# Patient Record
Sex: Male | Born: 1995 | Race: Black or African American | Hispanic: No | Marital: Single | Smoking: Never smoker
Health system: Southern US, Community
[De-identification: ages and names within clinical notes are randomized; demographics above are authoritative.]

## PROBLEM LIST (undated history)

## (undated) HISTORY — PX: APPENDECTOMY: SHX54

---
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
## 2017-04-06 DIAGNOSIS — R5381 Other malaise: Secondary | ICD-10-CM | POA: Diagnosis not present

## 2017-04-06 DIAGNOSIS — Z0000 Encounter for general adult medical examination without abnormal findings: Secondary | ICD-10-CM | POA: Diagnosis not present

## 2017-04-06 DIAGNOSIS — Z13220 Encounter for screening for lipoid disorders: Secondary | ICD-10-CM | POA: Diagnosis not present

## 2017-05-05 ENCOUNTER — Emergency Department (HOSPITAL_BASED_OUTPATIENT_CLINIC_OR_DEPARTMENT_OTHER): Payer: Self-pay

## 2017-05-05 ENCOUNTER — Emergency Department (HOSPITAL_BASED_OUTPATIENT_CLINIC_OR_DEPARTMENT_OTHER)
Admission: EM | Admit: 2017-05-05 | Discharge: 2017-05-05 | Disposition: A | Discharge status: Home / Self Care | Payer: Self-pay | Attending: Emergency Medicine | Admitting: Emergency Medicine

## 2017-05-05 ENCOUNTER — Encounter (HOSPITAL_BASED_OUTPATIENT_CLINIC_OR_DEPARTMENT_OTHER): Payer: Self-pay

## 2017-05-05 DIAGNOSIS — Y9361 Activity, american tackle football: Secondary | ICD-10-CM | POA: Insufficient documentation

## 2017-05-05 DIAGNOSIS — W0110XA Fall on same level from slipping, tripping and stumbling with subsequent striking against unspecified object, initial encounter: Secondary | ICD-10-CM | POA: Insufficient documentation

## 2017-05-05 DIAGNOSIS — T300 Burn of unspecified body region, unspecified degree: Secondary | ICD-10-CM

## 2017-05-05 DIAGNOSIS — T24132A Burn of first degree of left lower leg, initial encounter: Secondary | ICD-10-CM | POA: Insufficient documentation

## 2017-05-05 DIAGNOSIS — M79645 Pain in left finger(s): Secondary | ICD-10-CM | POA: Insufficient documentation

## 2017-05-05 DIAGNOSIS — Y92321 Football field as the place of occurrence of the external cause: Secondary | ICD-10-CM | POA: Insufficient documentation

## 2017-05-05 DIAGNOSIS — Y999 Unspecified external cause status: Secondary | ICD-10-CM | POA: Insufficient documentation

## 2017-05-05 DIAGNOSIS — Y92410 Unspecified street and highway as the place of occurrence of the external cause: Secondary | ICD-10-CM | POA: Insufficient documentation

## 2017-05-05 MED ORDER — SILVER SULFADIAZINE 1 % EX CREA: 1.0000 "application " | TOPICAL_CREAM | Freq: Every day | CUTANEOUS | 0 refills | Status: DC

## 2017-05-05 MED ORDER — IBUPROFEN 400 MG PO TABS
600.0000 mg | ORAL_TABLET | Freq: Once | ORAL | Status: AC
  Administered 2017-05-05: 17:00:00 600 mg via ORAL
  Filled 2017-05-05: qty 1

## 2017-05-05 NOTE — ED Triage Notes (Addendum)
Pt c/o "turf burn" to left LE x 3 days ago playing football-injured left thumb yesterday plalying football-NAD-steady gait

## 2017-05-05 NOTE — ED Notes (Signed)
Mild swelling noted lateral L thumb, ROM intact. L lower leg has significant road rash over anterior surface. Wound bed is pink with no exudate noted.

## 2017-05-05 NOTE — ED Provider Notes (Signed)
[facility] Provider [facility]   CSN: [id number] Arrival date & time: 05/05/17  1359     History   Chief Complaint Chief Complaint  Patient presents with  . Leg Injury  . Finger Injury    HPI Alex Scott is a 21 y.o. male who presents to the emergency department today for lower leg injury and left thumb injury. The patient states that 3 days ago while playing football he presented to make a tackle and slid on the turf. He experienced a superficial burn to the lower leg as a result of this. He notes that this is very painful to touch but is otherwise asymptomatic. He notes no drainage over the area. This was evaluated by his athletic trainer who applied antibiotic ointment and dressing. While at practice yesterday the patient was attempting to make a tackle when he fell on an outstretched hand. Since he has had the pain at the base of the left thumb. This is aggravated with range of motion and palpation. He has not taken anything for this. He denies fever, chills, numbness, tingling of extremities.  HPI  History reviewed. No pertinent past medical history.  There are no active problems to display for this patient.   Past Surgical History:  Procedure Laterality Date  . APPENDECTOMY         Home Medications    Prior to Admission medications   Medication Sig Start Date End Date Taking? Authorizing Provider  silver sulfADIAZINE (SILVADENE) 1 % cream Apply 1 application topically daily. 05/05/17   Lacy Sofia, Elmer Sow, PA-C    Family History No family history on file.  Social History Social History  Substance Use Topics  . Smoking status: Never Smoker  . Smokeless tobacco: Never Used  . Alcohol use No     Allergies   Patient has no known allergies.   Review of Systems Review of Systems  Constitutional: Negative for chills and fever.  Musculoskeletal: Positive for arthralgias. Negative for joint swelling.  Skin: Positive for wound.  Neurological: Negative  for weakness and numbness.     Physical Exam Updated Vital Signs BP 137/69 (BP Location: Left Arm)   Pulse 73   Temp 98.6 F (37 C) (Oral)   Resp 18   Ht 5\' 11"  (1.803 m)   Wt 91.4 kg (201 lb 8 oz)   SpO2 100%   BMI 28.10 kg/m   Physical Exam  Constitutional: He appears well-developed and well-nourished.  HENT:  Head: Normocephalic and atraumatic.  Right Ear: External ear normal.  Left Ear: External ear normal.  Eyes: Conjunctivae are normal. Right eye exhibits no discharge. Left eye exhibits no discharge. No scleral icterus.  Cardiovascular:  Pulses:      Radial pulses are 2+ on the right side, and 2+ on the left side.       Dorsalis pedis pulses are 2+ on the right side, and 2+ on the left side.       Posterior tibial pulses are 2+ on the right side, and 2+ on the left side.  Pulmonary/Chest: Effort normal. No respiratory distress.  Musculoskeletal:       Left knee: Normal.       Left ankle: Normal.  Left lower leg: Compartments soft. Neurovascular intact  No gross deformities, skin intact. Fingers appear normal. Tenderness palpation over snuffbox. Radial arteries 2+ with <2sec cap refill. SILT in M/U/R distributions. Dynamic 2-pt discrimination intact over median and ulnar nerve distributions on the ulnar/radial aspects of digits. Sharp/dull sensation  intact at dorsal 1st webspace. Grip 5/5 strength.  Finger adduction/abduction intact with 5/5 strength.  Thumb opposition mildly limited. Full ROM to Flexion/Extension at wrist, MCP, PIP and DIP.  FDS/FDP intact.   Neurological: He is alert.  Skin: No pallor.  15 cm area of superficial burn to the skin. Tender to palpation. No surrounding heat, erythema, drainage or induration.  Psychiatric: He has a normal mood and affect.  Nursing note and vitals reviewed.      ED Treatments / Results  Labs (all labs ordered are listed, but only abnormal results are displayed) Labs Reviewed - No data to display  EKG  EKG  Interpretation None       Radiology Dg Hand Complete Left  Result Date: 05/05/2017 CLINICAL DATA:  Football injury today. Limited range of motion of the thumb. EXAM: LEFT HAND - COMPLETE 3+ VIEW COMPARISON:  Thumb radiographs, same date. FINDINGS: The joint spaces are maintained.  No acute fracture is identified. IMPRESSION: No acute bony findings. Electronically Signed   By: Rudie Meyer M.D.   On: 05/05/2017 16:32   Dg Finger Thumb Left  Result Date: 05/05/2017 CLINICAL DATA:  Injured thumb playing football yesterday EXAM: LEFT THUMB 2+V COMPARISON:  None. FINDINGS: Alignment is normal. Joint spaces appear normal. No acute fracture is seen. IMPRESSION: Negative. Electronically Signed   By: Dwyane Dee M.D.   On: 05/05/2017 14:38    Procedures Procedures (including critical care time)  Medications Ordered in ED Medications  ibuprofen (ADVIL,MOTRIN) tablet 600 mg (600 mg Oral Given 05/05/17 1705)     Initial Impression / Assessment and Plan / ED Course  I have reviewed the triage vital signs and the nursing notes.  Pertinent labs & imaging results that were available during my care of the patient were reviewed by me and considered in my medical decision making (see chart for details).     21 year old male with left thumb injury and superficial burn to left leg. Patient with snuffbox tenderness, otherwise reassuring hand exam. X-rays are negative for fracture or dislocation. Will place the patient in a thumb spica splint with follow-up to orthopedics. Rice protocol advised. Advised the patient to not play football or contact sports until he is seen by orthopedics. Pain controlled in the emergency department. Patient with superficial burn to the left lower extremity as a result of sliding on turf. Wound cleaned and dressed in the emergency department with application of topical antibiotic ointment. Will prescribe the patient silver sulfadiazine to prevent infection at home. Will have  the patient follow with his PCP this week for wound recheck. Specific return precautions discussed. The patient verbalized understanding and agreement with plan. All questions answered. No further questions at this time. The patient is hemodynamically stable, mentating appropriately and appears safe for discharge.  Final Clinical Impressions(s) / ED Diagnoses   Final diagnoses:  Pain of left thumb  Superficial burn    New Prescriptions Discharge Medication List as of 05/05/2017  5:13 PM    START taking these medications   Details  silver sulfADIAZINE (SILVADENE) 1 % cream Apply 1 application topically daily., Starting Tue 05/05/2017, Print         Avantika Shere, Elmer Sow, PA-C 05/06/17 0139    Nira Conn, MD 05/06/17 [phone]

## 2017-05-05 NOTE — Discharge Instructions (Signed)
Your hand exam is concerning for what is called a Scaphoid fracture. I have attached a handout on this. Please remain in your splint that was provided to you in the ED till then. Your initial x-rays are negative. This is not always found on initial xrays so you are being treated as though you have this. Please follow up with Ortho this week. Please refrain from playing football or contact sports until then. Please follow rice therapy (attached). I have provided you with an antibiotic ointment that should be applied to your skin burn over. The dressing applied here may remain for 1-2 weeks and the ointment can be applied over this. Please return for fever, red streaking up the leg, pus like drainage. Please follow up with your PCP in 1 week for re-check for this.

## 2017-05-12 DIAGNOSIS — S63642A Sprain of metacarpophalangeal joint of left thumb, initial encounter: Secondary | ICD-10-CM | POA: Diagnosis not present

## 2017-05-26 DIAGNOSIS — S63642D Sprain of metacarpophalangeal joint of left thumb, subsequent encounter: Secondary | ICD-10-CM | POA: Diagnosis not present

## 2017-06-04 ENCOUNTER — Ambulatory Visit (INDEPENDENT_AMBULATORY_CARE_PROVIDER_SITE_OTHER): Payer: BLUE CROSS/BLUE SHIELD | Admitting: Physician Assistant

## 2017-06-04 DIAGNOSIS — Z23 Encounter for immunization: Secondary | ICD-10-CM | POA: Diagnosis not present

## 2017-06-15 NOTE — Progress Notes (Signed)
Needs MMR to stay in school.

## 2017-06-16 DIAGNOSIS — S63642D Sprain of metacarpophalangeal joint of left thumb, subsequent encounter: Secondary | ICD-10-CM | POA: Diagnosis not present

## 2017-09-20 DIAGNOSIS — J069 Acute upper respiratory infection, unspecified: Secondary | ICD-10-CM | POA: Diagnosis not present

## 2017-09-20 DIAGNOSIS — R05 Cough: Secondary | ICD-10-CM | POA: Diagnosis not present

## 2017-09-20 DIAGNOSIS — R509 Fever, unspecified: Secondary | ICD-10-CM | POA: Diagnosis not present

## 2017-09-29 IMAGING — CR DG FINGER THUMB 2+V*L*
3 series · 3 of 3 positions shown · non-contrast
Comparison: None.

CLINICAL DATA: Injured thumb playing football yesterday

EXAM:
LEFT THUMB 2+V

[x finger pa left]
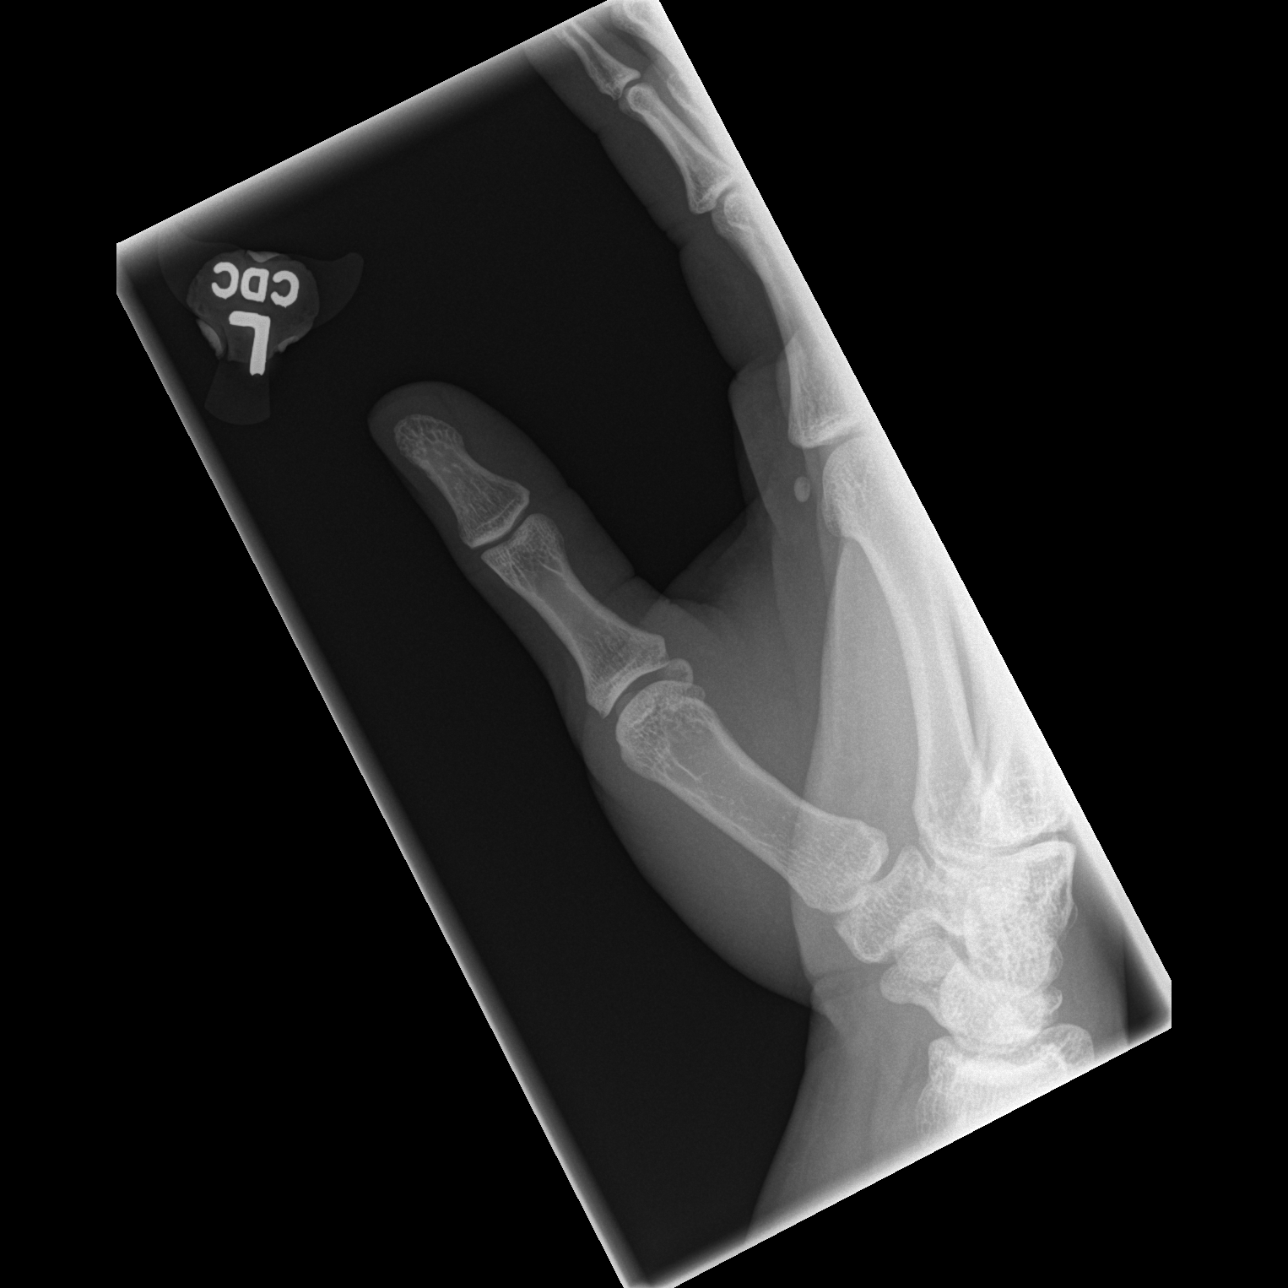

[x finger obl. left]
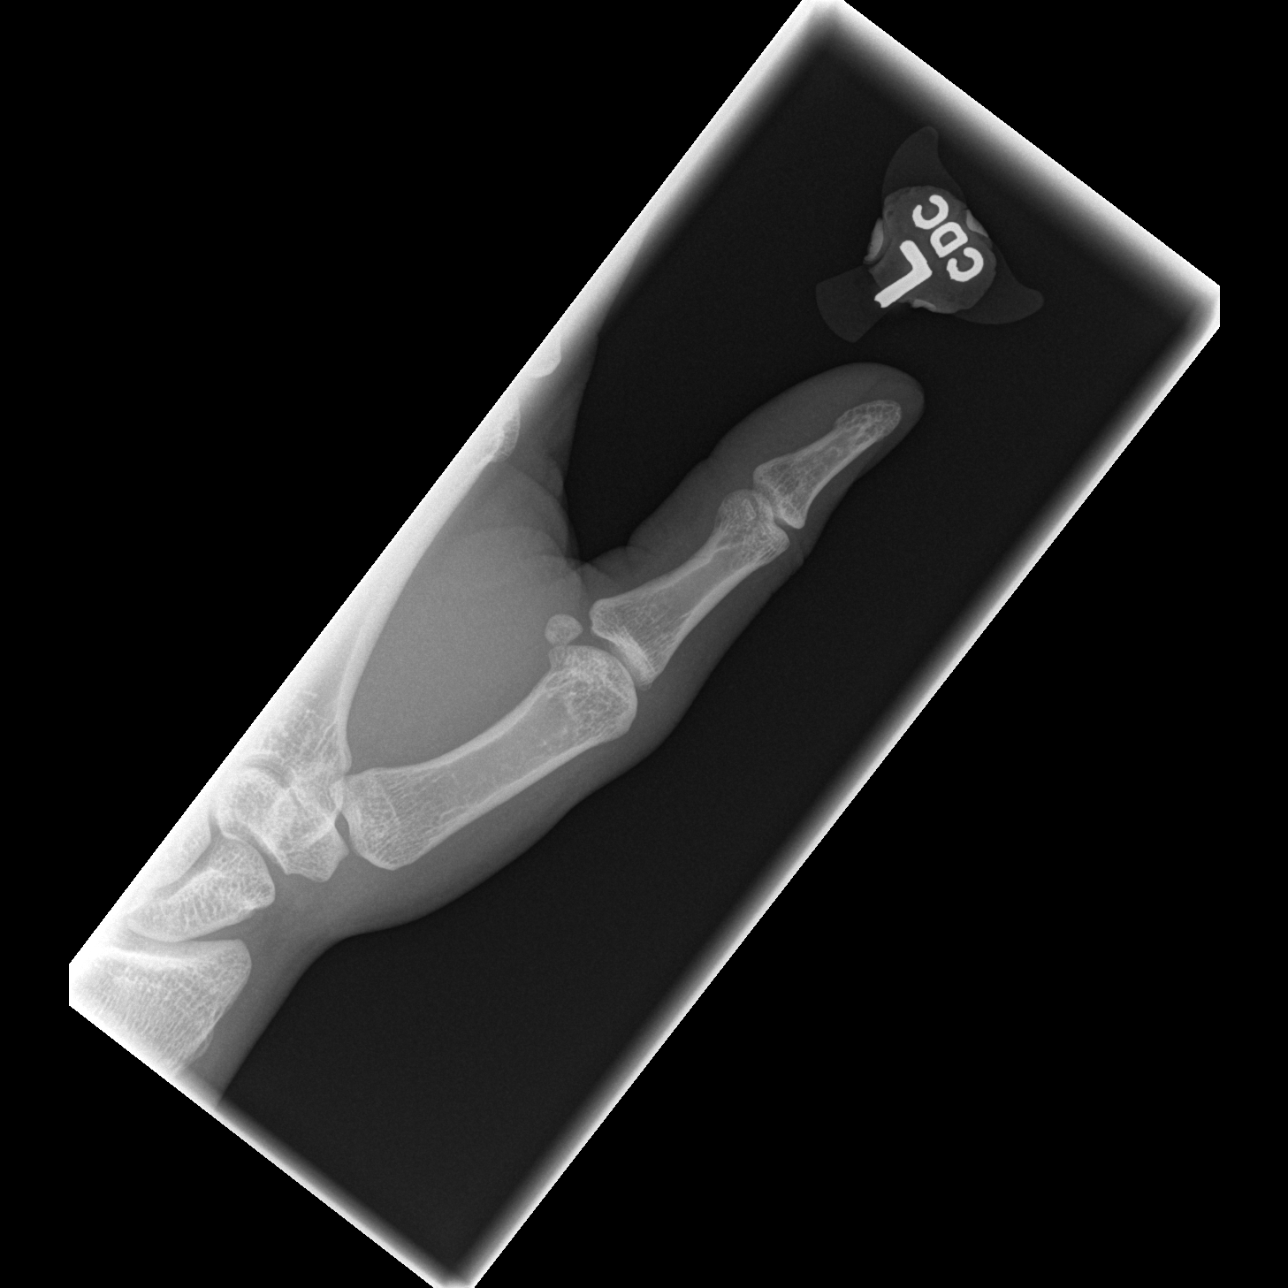

[x finger lateral left]
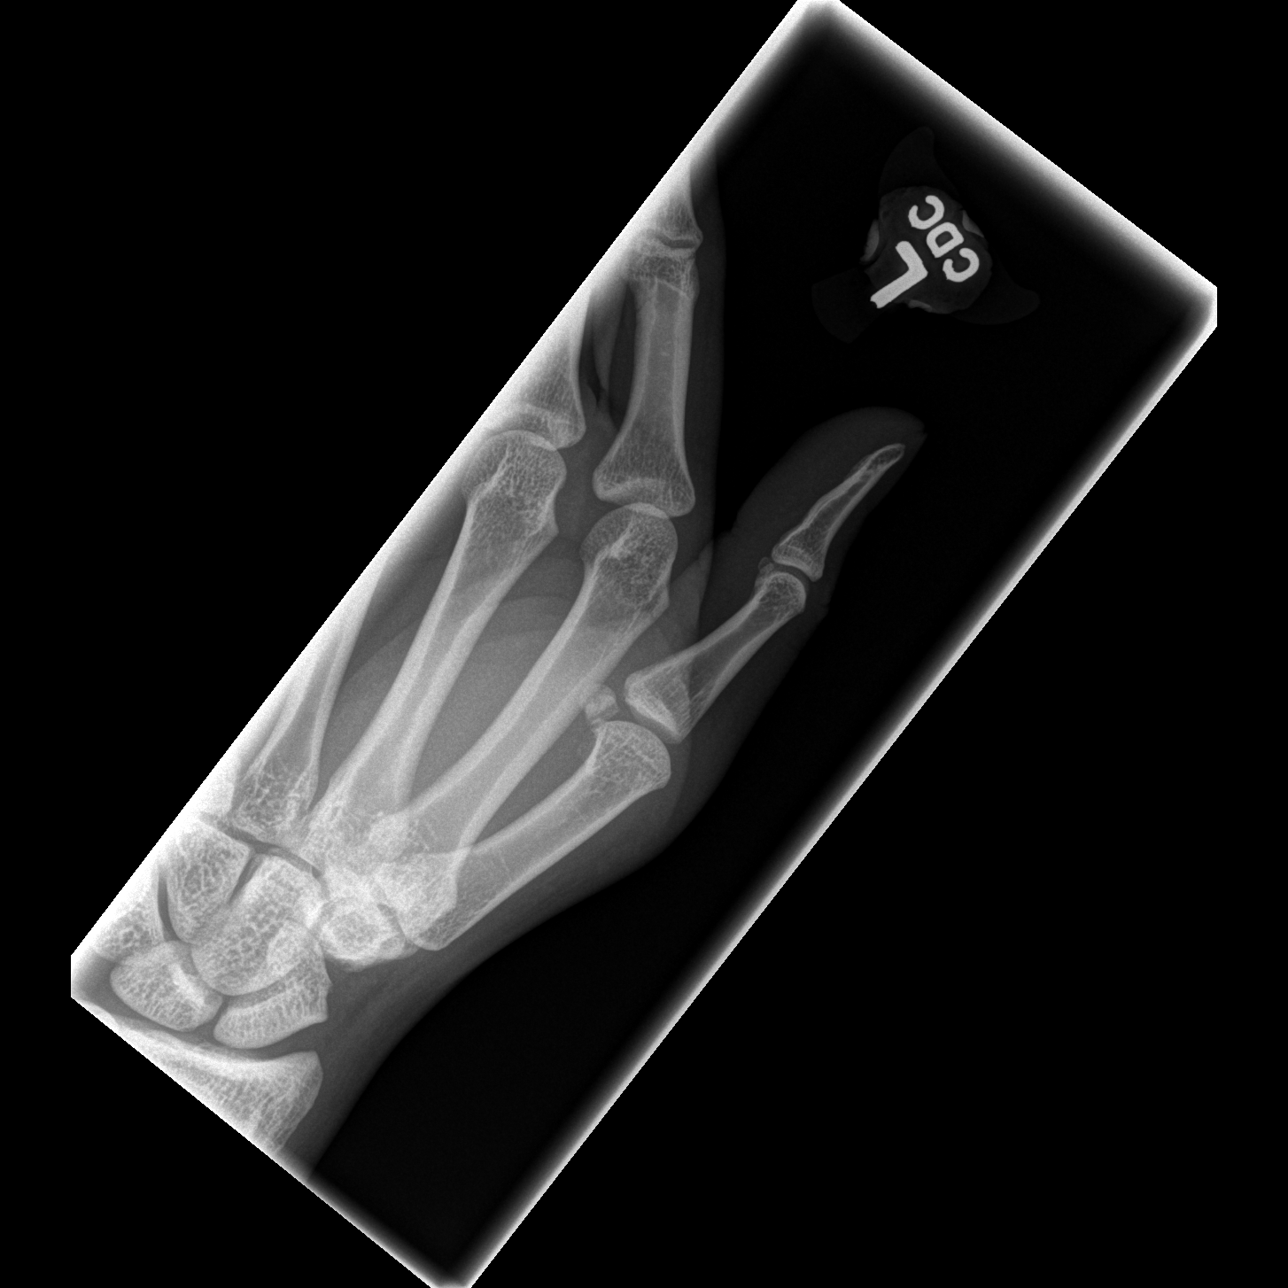

[3 of 3 positions shown; findings below may reference images not displayed]

FINDINGS: Alignment is normal. Joint spaces appear normal. No acute fracture
is seen.
IMPRESSION: Negative.

## 2018-12-20 DIAGNOSIS — R0602 Shortness of breath: Secondary | ICD-10-CM | POA: Diagnosis not present

## 2018-12-20 DIAGNOSIS — R51 Headache: Secondary | ICD-10-CM | POA: Diagnosis not present

## 2018-12-20 DIAGNOSIS — B349 Viral infection, unspecified: Secondary | ICD-10-CM | POA: Diagnosis not present

## 2019-05-27 DIAGNOSIS — N62 Hypertrophy of breast: Secondary | ICD-10-CM | POA: Diagnosis not present

## 2019-05-27 DIAGNOSIS — R5381 Other malaise: Secondary | ICD-10-CM | POA: Diagnosis not present

## 2019-05-27 DIAGNOSIS — R41840 Attention and concentration deficit: Secondary | ICD-10-CM | POA: Diagnosis not present

## 2019-06-27 DIAGNOSIS — M25512 Pain in left shoulder: Secondary | ICD-10-CM | POA: Diagnosis not present

## 2019-06-30 DIAGNOSIS — F12180 Cannabis abuse with cannabis-induced anxiety disorder: Secondary | ICD-10-CM | POA: Diagnosis not present

## 2019-06-30 DIAGNOSIS — F3340 Major depressive disorder, recurrent, in remission, unspecified: Secondary | ICD-10-CM | POA: Diagnosis not present

## 2019-07-12 DIAGNOSIS — F12180 Cannabis abuse with cannabis-induced anxiety disorder: Secondary | ICD-10-CM | POA: Diagnosis not present

## 2019-07-12 DIAGNOSIS — F411 Generalized anxiety disorder: Secondary | ICD-10-CM | POA: Diagnosis not present

## 2019-07-12 DIAGNOSIS — F3340 Major depressive disorder, recurrent, in remission, unspecified: Secondary | ICD-10-CM | POA: Diagnosis not present

## 2019-07-14 DIAGNOSIS — F12180 Cannabis abuse with cannabis-induced anxiety disorder: Secondary | ICD-10-CM | POA: Diagnosis not present

## 2019-07-14 DIAGNOSIS — F411 Generalized anxiety disorder: Secondary | ICD-10-CM | POA: Diagnosis not present

## 2019-07-14 DIAGNOSIS — F3340 Major depressive disorder, recurrent, in remission, unspecified: Secondary | ICD-10-CM | POA: Diagnosis not present

## 2019-08-08 DIAGNOSIS — R079 Chest pain, unspecified: Secondary | ICD-10-CM | POA: Diagnosis not present

## 2019-08-08 DIAGNOSIS — R0789 Other chest pain: Secondary | ICD-10-CM | POA: Diagnosis not present

## 2021-06-25 DIAGNOSIS — R251 Tremor, unspecified: Secondary | ICD-10-CM | POA: Insufficient documentation

## 2023-02-24 ENCOUNTER — Encounter (HOSPITAL_COMMUNITY): Payer: Self-pay

## 2023-02-24 ENCOUNTER — Emergency Department (HOSPITAL_COMMUNITY): Payer: Self-pay

## 2023-02-24 ENCOUNTER — Other Ambulatory Visit: Payer: Self-pay

## 2023-02-24 ENCOUNTER — Observation Stay (HOSPITAL_COMMUNITY)
Admission: EM | Admit: 2023-02-24 | Discharge: 2023-02-26 | Disposition: A | Discharge status: Home / Self Care | Payer: Self-pay | Attending: Internal Medicine | Admitting: Internal Medicine

## 2023-02-24 ENCOUNTER — Observation Stay (HOSPITAL_COMMUNITY): Payer: Self-pay

## 2023-02-24 DIAGNOSIS — J9311 Primary spontaneous pneumothorax: Principal | ICD-10-CM | POA: Insufficient documentation

## 2023-02-24 DIAGNOSIS — J939 Pneumothorax, unspecified: Secondary | ICD-10-CM | POA: Diagnosis present

## 2023-02-24 LAB — CBC WITH DIFFERENTIAL/PLATELET
Abs Immature Granulocytes: 0.01 K/uL (ref 0.00–0.07)
Basophils Absolute: 0 K/uL (ref 0.0–0.1)
Basophils Relative: 0 %
Eosinophils Absolute: 0 K/uL (ref 0.0–0.5)
Eosinophils Relative: 0 %
HCT: 41.7 % (ref 39.0–52.0)
Hemoglobin: 14.8 g/dL (ref 13.0–17.0)
Immature Granulocytes: 0 %
Lymphocytes Relative: 42 %
Lymphs Abs: 2.9 K/uL (ref 0.7–4.0)
MCH: 29.2 pg (ref 26.0–34.0)
MCHC: 35.5 g/dL (ref 30.0–36.0)
MCV: 82.2 fL (ref 80.0–100.0)
Monocytes Absolute: 0.6 K/uL (ref 0.1–1.0)
Monocytes Relative: 9 %
Neutro Abs: 3.3 K/uL (ref 1.7–7.7)
Neutrophils Relative %: 49 %
Platelets: 227 K/uL (ref 150–400)
RBC: 5.07 MIL/uL (ref 4.22–5.81)
RDW: 13.1 % (ref 11.5–15.5)
WBC: 6.8 K/uL (ref 4.0–10.5)
nRBC: 0 % (ref 0.0–0.2)

## 2023-02-24 LAB — BASIC METABOLIC PANEL
Anion gap: 8 (ref 5–15)
BUN: 10 mg/dL (ref 6–20)
CO2: 23 mmol/L (ref 22–32)
Calcium: 9 mg/dL (ref 8.9–10.3)
Chloride: 105 mmol/L (ref 98–111)
Creatinine, Ser: 1.04 mg/dL (ref 0.61–1.24)
GFR, Estimated: >60 mL/min (ref >60)
Glucose, Bld: 97 mg/dL (ref 70–99)
Potassium: 3.8 mmol/L (ref 3.5–5.1)
Sodium: 136 mmol/L (ref 135–145)

## 2023-02-24 LAB — HIV ANTIBODY (ROUTINE TESTING W REFLEX): HIV Screen 4th Generation wRfx: NONREACTIVE

## 2023-02-24 MED ORDER — ENOXAPARIN SODIUM 40 MG/0.4ML IJ SOSY
40.0000 mg | PREFILLED_SYRINGE | INTRAMUSCULAR | Status: DC
  Administered 2023-02-24: 40 mg via SUBCUTANEOUS
  Filled 2023-02-24: qty 0.4

## 2023-02-24 MED ORDER — SODIUM CHLORIDE 0.9% FLUSH
3.0000 mL | Freq: Two times a day (BID) | INTRAVENOUS | Status: DC
  Administered 2023-02-24 – 2023-02-26 (×4): 3 mL via INTRAVENOUS

## 2023-02-24 MED ORDER — ACETAMINOPHEN 500 MG PO TABS: 500.0000 mg | ORAL_TABLET | Freq: Two times a day (BID) | ORAL | Status: DC | PRN

## 2023-02-24 MED ORDER — ORAL CARE MOUTH RINSE: 15.0000 mL | OROMUCOSAL | Status: DC | PRN

## 2023-02-24 MED ORDER — SODIUM CHLORIDE 0.9% FLUSH: 3.0000 mL | INTRAVENOUS | Status: DC | PRN

## 2023-02-24 MED ORDER — KETOROLAC TROMETHAMINE 30 MG/ML IJ SOLN
30.0000 mg | Freq: Four times a day (QID) | INTRAMUSCULAR | Status: DC | PRN
  Administered 2023-02-24: 30 mg via INTRAVENOUS
  Filled 2023-02-24: qty 1

## 2023-02-24 MED ORDER — ZOLPIDEM TARTRATE 5 MG PO TABS: 5.0000 mg | ORAL_TABLET | Freq: Every evening | ORAL | Status: DC | PRN

## 2023-02-24 MED ORDER — SODIUM CHLORIDE 0.9 % IV SOLN: 250.0000 mL | INTRAVENOUS | Status: DC | PRN

## 2023-02-24 MED ORDER — IOHEXOL 350 MG/ML SOLN
75.0000 mL | Freq: Once | INTRAVENOUS | Status: AC | PRN
  Administered 2023-02-24: 75 mL via INTRAVENOUS

## 2023-02-24 NOTE — Subjective & Objective (Signed)
Mr. Alex Scott a healthy 27 year old had the onset of right sided chest pain 02/23/23. He denies any hard coughing, physical strain. He vapes and smoke marijuana. He went to [facility], where CXR revealed a small apical pneumothorax on the right. He was referred to [facility] for further evaluation.

## 2023-02-24 NOTE — ED Notes (Signed)
ED TO INPATIENT HANDOFF REPORT  ED Nurse Name and Phone #: Percival Spanish [phone]  S Name/Age/Gender Alex Scott 27 y.o. male Room/Bed: RESUSC/RESUSC  Code Status   Code Status: Full Code  Home/SNF/Other Home Patient oriented to: self, place, time, and situation Is this baseline? Yes   Triage Complete: Triage complete  Chief Complaint Pneumothorax on right [J93.9]  Triage Note Pt arrives via POV. Pt reports while at work yesterday he felt a sudden onset of pain in right side of his ribs and sob. Pt was seen at [facility] earlier today and was told he has a pneumothorax. Pt AxOx4. Denies any injury at work. Pt denies sob but states it hurts when he takes a deep breath.    Allergies No Known Allergies  Level of Care/Admitting Diagnosis ED Disposition     ED Disposition  Admit   Condition  --   Comment  Hospital Area: [facility] [[id number]]  Level of Care: Med-Surg [16]  May place patient in observation at [facility] or [facility] if equivalent level of care is available:: No  Covid Evaluation: Asymptomatic - no recent exposure (last 10 days) testing not required  Diagnosis: Pneumothorax on right [981191]  Admitting Physician: Jacques Navy [[id number]]  Attending Physician: Illene Regulus E [[id number]]          B Medical/Surgery History History reviewed. No pertinent past medical history. Past Surgical History:  Procedure Laterality Date   APPENDECTOMY       A IV Location/Drains/Wounds Patient Lines/Drains/Airways Status     Active Line/Drains/Airways     Name Placement date Placement time Site Days   Peripheral IV 02/24/23 20 G Left Antecubital 02/24/23  1608  Antecubital  less than 1            Intake/Output Last 24 hours No intake or output data in the 24 hours ending 02/24/23 1804  Labs/Imaging Results for orders placed or performed during the hospital encounter of 02/24/23 (from the past 48 hour(s))  CBC with Differential     Status:  None   Collection Time: 02/24/23  4:05 PM  Result Value Ref Range   WBC 6.8 4.0 - 10.5 K/uL   RBC 5.07 4.22 - 5.81 MIL/uL   Hemoglobin 14.8 13.0 - 17.0 g/dL   HCT 47.8 29.5 - 62.1 %   MCV 82.2 80.0 - 100.0 fL   MCH 29.2 26.0 - 34.0 pg   MCHC 35.5 30.0 - 36.0 g/dL   RDW 30.8 65.7 - 84.6 %   Platelets 227 150 - 400 K/uL   nRBC 0.0 0.0 - 0.2 %   Neutrophils Relative % 49 %   Neutro Abs 3.3 1.7 - 7.7 K/uL   Lymphocytes Relative 42 %   Lymphs Abs 2.9 0.7 - 4.0 K/uL   Monocytes Relative 9 %   Monocytes Absolute 0.6 0.1 - 1.0 K/uL   Eosinophils Relative 0 %   Eosinophils Absolute 0.0 0.0 - 0.5 K/uL   Basophils Relative 0 %   Basophils Absolute 0.0 0.0 - 0.1 K/uL   Immature Granulocytes 0 %   Abs Immature Granulocytes 0.01 0.00 - 0.07 K/uL    Comment: Performed at [facility], [address], [place], [place] [address]  Basic metabolic panel     Status: None   Collection Time: 02/24/23  4:05 PM  Result Value Ref Range   Sodium 136 135 - 145 mmol/L   Potassium 3.8 3.5 - 5.1 mmol/L    Comment: HEMOLYSIS  AT THIS LEVEL MAY AFFECT RESULT   Chloride 105 98 - 111 mmol/L   CO2 23 22 - 32 mmol/L   Glucose, Bld 97 70 - 99 mg/dL    Comment: Glucose reference range applies only to samples taken after fasting for at least 8 hours.   BUN 10 6 - 20 mg/dL   Creatinine, Ser 8.11 0.61 - 1.24 mg/dL   Calcium 9.0 8.9 - 91.4 mg/dL   GFR, Estimated >78 >29 mL/min    Comment: (NOTE) Calculated using the CKD-EPI Creatinine Equation (2021)    Anion gap 8 5 - 15    Comment: Performed at [facility], [address], [place], [place] [address]   DG Chest 2 View  Result Date: 02/24/2023 CLINICAL DATA:  Pneumothorax EXAM: CHEST - 2 VIEW COMPARISON:  12/18/2013 FINDINGS: Small right apical pneumothorax measuring 17 mm from visceral to parietal pleura. Otherwise no consolidation, pleural effusion or edema. No left-sided pneumothorax. Normal cardiopericardial silhouette. Curvature of the  thoracolumbar spine. IMPRESSION: Small right apical pneumothorax. Critical Value/emergent results were called by telephone at the time of interpretation on 02/24/2023 at 1:22 pm to provider Dr. Doran Durand, who verbally acknowledged these results. Electronically Signed   By: Karen Kays M.D.   On: 02/24/2023 16:46    Pending Labs Unresulted Labs (From admission, onward)     Start     Ordered   03/03/23 0500  Creatinine, serum  (enoxaparin (LOVENOX)    CrCl >/= 30 ml/min)  Weekly,   R     Comments: while on enoxaparin therapy    02/24/23 1801   02/25/23 0500  Basic metabolic panel  Tomorrow morning,   R        02/24/23 1801   02/24/23 1801  HIV Antibody (routine testing w rflx)  (HIV Antibody (Routine testing w reflex) panel)  Add-on,   AD        02/24/23 1801            Vitals/Pain Today's Vitals   02/24/23 1630 02/24/23 1700 02/24/23 1730 02/24/23 1802  BP: 123/67 134/79 130/84   Pulse: 67 (!) 102 (!) 57   Resp: 14 16 14    Temp:    97.6 F (36.4 C)  TempSrc:    Oral  SpO2: 100% 100% 100%   Weight:      Height:      PainSc:        Isolation Precautions No active isolations  Medications Medications  acetaminophen (TYLENOL) tablet 500 mg (has no administration in time range)  enoxaparin (LOVENOX) injection 40 mg (has no administration in time range)  sodium chloride flush (NS) 0.9 % injection 3 mL (has no administration in time range)  sodium chloride flush (NS) 0.9 % injection 3 mL (has no administration in time range)  0.9 %  sodium chloride infusion (has no administration in time range)  ketorolac (TORADOL) 30 MG/ML injection 30 mg (has no administration in time range)  zolpidem (AMBIEN) tablet 5 mg (has no administration in time range)    Mobility walks     Focused Assessments Pulmonary Assessment Handoff:  Lung sounds: L Breath Sounds: Clear R Breath Sounds: Absent O2 Device: Room Air      R Recommendations: See Admitting Provider Note  Report  given to:   Additional Notes:

## 2023-02-24 NOTE — Assessment & Plan Note (Addendum)
Healthy young man with sudden onset chest pain 02/23/23 with x-ray revealing 17mm apical pneumothorax right.  Plan Med-surg obs  Keep O2 sat>92 %  CT chest w/ contrast - r/o structual abnormalities.   F/u Xray in AM, home if no progression.

## 2023-02-24 NOTE — ED Notes (Signed)
Pt placed on NRB at 15L 02

## 2023-02-24 NOTE — ED Triage Notes (Signed)
Pt arrives via POV. Pt reports while at work yesterday he felt a sudden onset of pain in right side of his ribs and sob. Pt was seen at [facility] earlier today and was told he has a pneumothorax. Pt AxOx4. Denies any injury at work. Pt denies sob but states it hurts when he takes a deep breath.

## 2023-02-24 NOTE — ED Provider Notes (Signed)
[place] EMERGENCY DEPARTMENT AT [facility] Provider Note   CSN: [id number] Arrival date & time: 02/24/23  1427     History Chief Complaint  Patient presents with   Chest Pain    Pneumothorax     HPI Alex Scott is a 27 y.o. male presenting for chief complaint of right-sided chest pain.  Had an x-ray done in urgent care that demonstrated a right-sided pneumothorax. States that it started while he was pushing a cart at work.  It has been progressive in nature but seems to be improving throughout the day today.  X-ray at urgent care showed a right-sided pneumothorax.  Denies any medical history denies fevers chills nausea vomiting syncope or shortness of breath.  Otherwise ambulatory tolerating p.o. intake.  Patient's recorded medical, surgical, social, medication list and allergies were reviewed in the Snapshot window as part of the initial history.   Review of Systems   Review of Systems  Constitutional:  Negative for chills and fever.  HENT:  Negative for ear pain and sore throat.   Eyes:  Negative for pain and visual disturbance.  Respiratory:  Negative for cough and shortness of breath.   Cardiovascular:  Positive for chest pain. Negative for palpitations.  Gastrointestinal:  Negative for abdominal pain and vomiting.  Genitourinary:  Negative for dysuria and hematuria.  Musculoskeletal:  Negative for arthralgias and back pain.  Skin:  Negative for color change and rash.  Neurological:  Negative for seizures and syncope.  All other systems reviewed and are negative.   Physical Exam Updated Vital Signs BP 130/84   Pulse (!) 57   Temp 97.6 F (36.4 C) (Oral)   Resp 14   Ht 5\' 10"  (1.778 m)   Wt 83.9 kg   SpO2 100%   BMI 26.54 kg/m  Physical Exam Vitals and nursing note reviewed.  Constitutional:      General: He is not in acute distress.    Appearance: He is well-developed.  HENT:     Head: Normocephalic and atraumatic.  Eyes:      Conjunctiva/sclera: Conjunctivae normal.  Cardiovascular:     Rate and Rhythm: Normal rate and regular rhythm.     Heart sounds: No murmur heard. Pulmonary:     Effort: Pulmonary effort is normal. No respiratory distress.     Breath sounds: Normal breath sounds.  Abdominal:     Palpations: Abdomen is soft.     Tenderness: There is no abdominal tenderness.  Musculoskeletal:        General: No swelling.     Cervical back: Neck supple.  Skin:    General: Skin is warm and dry.     Capillary Refill: Capillary refill takes less than 2 seconds.  Neurological:     Mental Status: He is alert.  Psychiatric:        Mood and Affect: Mood normal.      ED Course/ Medical Decision Making/ A&P    Procedures Procedures   Medications Ordered in ED Medications  acetaminophen (TYLENOL) tablet 500 mg (has no administration in time range)  enoxaparin (LOVENOX) injection 40 mg (40 mg Subcutaneous Given 02/24/23 1808)  sodium chloride flush (NS) 0.9 % injection 3 mL (has no administration in time range)  sodium chloride flush (NS) 0.9 % injection 3 mL (has no administration in time range)  0.9 %  sodium chloride infusion (has no administration in time range)  ketorolac (TORADOL) 30 MG/ML injection 30 mg (has no administration in time range)  zolpidem (AMBIEN) tablet 5 mg (has no administration in time range)    Medical Decision Making:    Alex Scott is a 27 y.o. male who presented to the ED today with abnormal x-ray detailed above.     Complete initial physical exam performed, notably the patient  was hemodynamically stable no acute distress.      Reviewed and confirmed nursing documentation for past medical history, family history, social history.    Initial Assessment:   Patient presenting outside x-ray demonstrating a spontaneous pneumothorax. Placed on nonrebreather for management Medicine consulted for recommendation.  Initial Plan:  Screening labs including CBC and Metabolic  panel to evaluate for infectious or metabolic etiology of disease.  CXR to evaluate for structural/infectious intrathoracic pathology.   Reassessment: X-ray stable without acute indication for insertion of chest tube at this time.  Will observe closely on nonrebreather admit to the hospital for observation and planned patient reassessment in a.m. with repeat x-ray.  Disposition:  I have considered need for hospitalization, however, considering all of the above, I believe this patient is stable for discharge at this time.  Patient/family educated about specific return precautions for given chief complaint and symptoms.  Patient/family educated about follow-up with PCP.     Patient/family expressed understanding of return precautions and need for follow-up. Patient spoken to regarding all imaging and laboratory results and appropriate follow up for these results. All education provided in verbal form with additional information in written form. Time was allowed for answering of patient questions. Patient discharged.    Emergency Department Medication Summary:   Medications  acetaminophen (TYLENOL) tablet 500 mg (has no administration in time range)  enoxaparin (LOVENOX) injection 40 mg (40 mg Subcutaneous Given 02/24/23 1808)  sodium chloride flush (NS) 0.9 % injection 3 mL (has no administration in time range)  sodium chloride flush (NS) 0.9 % injection 3 mL (has no administration in time range)  0.9 %  sodium chloride infusion (has no administration in time range)  ketorolac (TORADOL) 30 MG/ML injection 30 mg (has no administration in time range)  zolpidem (AMBIEN) tablet 5 mg (has no administration in time range)         Clinical Impression:  1. Primary spontaneous pneumothorax      Admit   Final Clinical Impression(s) / ED Diagnoses Final diagnoses:  Primary spontaneous pneumothorax    Rx / DC Orders ED Discharge Orders     None         Glyn Ade,  MD 02/24/23 1821

## 2023-02-24 NOTE — H&P (Addendum)
History and Physical    Alex Scott BJY:[id number] DOB: September 25, 1995 DOA: 02/24/2023  DOS: the patient was seen and examined on 02/24/2023  PCP: Pcp, No   Patient coming from: Home  I have personally briefly reviewed patient's old medical records in [facility] Link  Alex Scott a healthy 27 year old had the onset of right sided chest pain 02/23/23. He denies any hard coughing, physical strain. He vapes and smoke marijuana. He went to [facility], where CXR revealed a small apical pneumothorax on the right. [facility] was referred [facility] for [facility] evaluation.    ED Course: T 98.1  134/79  HR 102  RR 16. No distress or physical findings per EDP. Lab: Cmet nl, CBCD nl. CXR - reveals 17 mm right apical pneumothorax. TRH called to admit for observation/management.   Review of Systems:  Review of Systems  Constitutional: Negative.   HENT: Negative.    Eyes: Negative.   Respiratory: Negative.    Cardiovascular: Negative.   Gastrointestinal: Negative.   Genitourinary: Negative.   Musculoskeletal: Negative.   Skin: Negative.   Neurological: Negative.   Endo/Heme/Allergies: Negative.   Psychiatric/Behavioral: Negative.      History reviewed. No pertinent past medical history.  Past Surgical History:  Procedure Laterality Date   APPENDECTOMY      Soc Hx - single. LIves at home. Work: Editor, commissioning.   reports that he has never smoked. He has never used smokeless tobacco. He reports current drug use. Drug: Marijuana. He reports that he does not drink alcohol.  No Known Allergies  History reviewed. No pertinent family history.  Prior to Admission medications   Medication Sig Start Date End Date Taking? Authorizing Provider  acetaminophen (TYLENOL) 500 MG tablet Take 500 mg by mouth 2 (two) times daily as needed for moderate pain.   Yes [provider]    Physical Exam: Vitals:   02/24/23 1630 02/24/23 1700 02/24/23 1730 02/24/23 1802  BP: 123/67 134/79  130/84   Pulse: 67 (!) 102 (!) 57   Resp: 14 16 14    Temp:    97.6 F (36.4 C)  TempSrc:    Oral  SpO2: 100% 100% 100%   Weight:      Height:        Physical Exam Vitals and nursing note reviewed.  Constitutional:      General: He is not in acute distress.    Appearance: He is well-developed and normal weight.  HENT:     Head: Normocephalic and atraumatic.  Eyes:     Extraocular Movements: Extraocular movements intact.     Pupils: Pupils are equal, round, and reactive to light.  Cardiovascular:     Rate and Rhythm: Regular rhythm. Tachycardia present. No extrasystoles are present.    Pulses:          Carotid pulses are 2+ on the right side and 2+ on the left side.      Radial pulses are 2+ on the right side and 2+ on the left side.       Dorsalis pedis pulses are 2+ on the right side and 2+ on the left side.       Posterior tibial pulses are 2+ on the right side and 2+ on the left side.     Heart sounds: Normal heart sounds.  Pulmonary:     Effort: Pulmonary effort is normal.     Breath sounds: Normal breath sounds.  Chest:     Chest wall: No mass, deformity  or tenderness.  Abdominal:     General: Bowel sounds are normal.     Palpations: Abdomen is soft.     Tenderness: There is no abdominal tenderness. There is no guarding.  Musculoskeletal:        General: Normal range of motion.     Cervical back: Normal range of motion and neck supple.  Skin:    General: Skin is warm and dry.  Neurological:     General: No focal deficit present.     Mental Status: He is alert and oriented to person, place, and time.     Cranial Nerves: No cranial nerve deficit.     Motor: No weakness.  Psychiatric:        Mood and Affect: Mood normal.        Behavior: Behavior normal.      Labs on Admission: I have personally reviewed following labs and imaging studies  CBC: Recent Labs  Lab 02/24/23 1605  WBC 6.8  NEUTROABS 3.3  HGB 14.8  HCT 41.7  MCV 82.2  PLT 227   Basic  Metabolic Panel: Recent Labs  Lab 02/24/23 1605  NA 136  K 3.8  CL 105  CO2 23  GLUCOSE 97  BUN 10  CREATININE 1.04  CALCIUM 9.0   GFR: Estimated Creatinine Clearance: 111.1 mL/min (by C-G formula based on SCr of 1.04 mg/dL). Liver Function Tests: No results for input(s): "AST", "ALT", "ALKPHOS", "BILITOT", "PROT", "ALBUMIN" in the last 168 hours. No results for input(s): "LIPASE", "AMYLASE" in the last 168 hours. No results for input(s): "AMMONIA" in the last 168 hours. Coagulation Profile: No results for input(s): "INR", "PROTIME" in the last 168 hours. Cardiac Enzymes: No results for input(s): "CKTOTAL", "CKMB", "CKMBINDEX", "TROPONINI" in the last 168 hours. BNP (last 3 results) No results for input(s): "PROBNP" in the last 8760 hours. HbA1C: No results for input(s): "HGBA1C" in the last 72 hours. CBG: No results for input(s): "GLUCAP" in the last 168 hours. Lipid Profile: No results for input(s): "CHOL", "HDL", "LDLCALC", "TRIG", "CHOLHDL", "LDLDIRECT" in the last 72 hours. Thyroid Function Tests: No results for input(s): "TSH", "T4TOTAL", "FREET4", "T3FREE", "THYROIDAB" in the last 72 hours. Anemia Panel: No results for input(s): "VITAMINB12", "FOLATE", "FERRITIN", "TIBC", "IRON", "RETICCTPCT" in the last 72 hours. Urine analysis: No results found for: "COLORURINE", "APPEARANCEUR", "LABSPEC", "PHURINE", "GLUCOSEU", "HGBUR", "BILIRUBINUR", "KETONESUR", "PROTEINUR", "UROBILINOGEN", "NITRITE", "LEUKOCYTESUR"  Radiological Exams on Admission: I have personally reviewed images DG Chest 2 View  Result Date: 02/24/2023 CLINICAL DATA:  Pneumothorax EXAM: CHEST - 2 VIEW COMPARISON:  12/18/2013 FINDINGS: Small right apical pneumothorax measuring 17 mm from visceral to parietal pleura. Otherwise no consolidation, pleural effusion or edema. No left-sided pneumothorax. Normal cardiopericardial silhouette. Curvature of the thoracolumbar spine. IMPRESSION: Small right apical  pneumothorax. Critical Value/emergent results were called by telephone at the time of interpretation on 02/24/2023 at 1:22 pm to provider Dr. Doran Durand, who verbally acknowledged these results. Electronically Signed   By: Karen Kays M.D.   On: 02/24/2023 16:46    EKG: I have personally reviewed EKG: NSR, no acute changes  Assessment/Plan Principal Problem:   Pneumothorax on right Active Problems:   Pneumothorax    Assessment and Plan: Pneumothorax Healthy young man with sudden onset chest pain 02/23/23 with x-ray revealing 17mm apical pneumothorax right.  Plan Med-surg obs  Keep O2 sat>92 %  CT chest w/ contrast - r/o structual abnormalities.   F/u Xray in AM, home if no progression.  DVT prophylaxis: Lovenox Code Status: Full Code Family Communication: mother and sister present during interview and exam. Understand dx and tx plan. Answered all questions  Disposition Plan: home 24 hrs  Consults called: none  Admission status: Observation, Med-Surg   Illene Regulus, MD [facility] 02/24/2023, 6:10 PM

## 2023-02-25 ENCOUNTER — Observation Stay (HOSPITAL_COMMUNITY): Payer: Self-pay

## 2023-02-25 LAB — BASIC METABOLIC PANEL
Anion gap: 8 (ref 5–15)
BUN: 12 mg/dL (ref 6–20)
CO2: 21 mmol/L — ABNORMAL LOW (ref 22–32)
Calcium: 8.8 mg/dL — ABNORMAL LOW (ref 8.9–10.3)
Chloride: 108 mmol/L (ref 98–111)
Creatinine, Ser: 1.14 mg/dL (ref 0.61–1.24)
GFR, Estimated: >60 mL/min (ref >60)
Glucose, Bld: 98 mg/dL (ref 70–99)
Potassium: 3.8 mmol/L (ref 3.5–5.1)
Sodium: 137 mmol/L (ref 135–145)

## 2023-02-25 NOTE — Plan of Care (Signed)
  Problem: Education: Goal: Knowledge of General Education information will improve Description: Including pain rating scale, medication(s)/side effects and non-pharmacologic comfort measures Outcome: Progressing   Problem: Clinical Measurements: Goal: Ability to maintain clinical measurements within normal limits will improve Outcome: Progressing Goal: Diagnostic test results will improve Outcome: Progressing   Problem: Coping: Goal: Level of anxiety will decrease Outcome: Progressing   Problem: Elimination: Goal: Will not experience complications related to bowel motility Outcome: Progressing   Problem: Safety: Goal: Ability to remain free from injury will improve Outcome: Progressing

## 2023-02-25 NOTE — TOC CM/SW Note (Signed)
  MATCH Medication Assistance Card Name: Alex Scott  ID (MRN): [id number] Bin: [id number] RX Group: [id number] Discharge Date: TBD Expiration Date:03/05/2023                                           (must be filled within 7 days of discharge)     You have been approved to have the prescriptions written by your discharging physician filled through our [facility] (Medication Assistance Through [facility]) program. This program allows for a one-time (no refills) 34-day supply of selected medications for a low copay amount.  The copay is $3.00 per prescription. For instance, if you have one prescription, you will pay $3.00; for two prescriptions, you pay $6.00; for three prescriptions, you pay $9.00; and so on.  Only certain pharmacies are participating in this program with [facility]. You will need to select one of the pharmacies from the attached list and take your prescriptions, this letter, and your photo ID to one of the [facility] pharmacies, [facility], [facility] [address], or [facility] [address].   We are excited that you are able to use the [facility] program to get your medications. These prescriptions must be filled within 7 days of hospital discharge or they will no longer be valid for the [facility] program. Should you have any problems with your prescriptions please contact your case management team member at [phone] for [place]/ [facility].  Thank you, [facility] Care Management

## 2023-02-25 NOTE — Plan of Care (Signed)

## 2023-02-25 NOTE — Progress Notes (Signed)
PROGRESS NOTE    Alex Scott  ZOX:[id number] DOB: Jan 21, 1996 DOA: 02/24/2023 PCP: Pcp, No    Chief Complaint  Patient presents with   Chest Pain    Pneumothorax     Brief Narrative:   Alex Scott a healthy 27 year old had the onset of right sided chest pain 02/23/23. He denies any hard coughing, physical strain. He vapes and smoke marijuana. He went to [facility], where CXR revealed a small apical pneumothorax on the right. [facility] was [facility] for further evaluation.       Assessment & Plan:   Principal Problem:   Pneumothorax on right Active Problems:   Pneumothorax   Spontaneous right sided pneumothorax -No clear etiology, her serial imaging including CT scan overnight and chest x-ray this morning appears unchanged once reviewed by pulmonary.  But no much improvement though, pulmonary input greatly appreciated, possibly he will need chest tube. -Continuous O2 for nitrogen washout serial chest x-ray - Management per PCCM    DVT prophylaxis: Lovenox Code Status: Full Family Communication: D/W mother at bedside Disposition:      Consultants:  PCCM  Subjective:  Denies any complaints today.  Objective: Vitals:   02/25/23 0200 02/25/23 0400 02/25/23 0757 02/25/23 1210  BP:  113/63 (!) 128/56   Pulse: (!) 57 (!) 55 86   Resp:  16 18   Temp:  97.8 F (36.6 C) 97.9 F (36.6 C)   TempSrc:  Oral Oral Oral  SpO2: 99% 97% 99% 98%  Weight:      Height:        Intake/Output Summary (Last 24 hours) at 02/25/2023 1429 Last data filed at 02/25/2023 1200 Gross per 24 hour  Intake 240 ml  Output 0 ml  Net 240 ml   Filed Weights   02/24/23 1442 02/24/23 1850  Weight: 83.9 kg 81.1 kg    Examination:  Awake Alert, Oriented X 3, No new F.N deficits, Normal affect Symmetrical Chest wall movement, Good air movement bilaterally, CTAB RRR,No Gallops,Rubs or new Murmurs, No Parasternal Heave +ve B.Sounds, Abd Soft, No tenderness, No rebound -  guarding or rigidity. No Cyanosis, Clubbing or edema, No new Rash or bruise   .     Data Reviewed: I have personally reviewed following labs and imaging studies  CBC: Recent Labs  Lab 02/24/23 1605  WBC 6.8  NEUTROABS 3.3  HGB 14.8  HCT 41.7  MCV 82.2  PLT 227    Basic Metabolic Panel: Recent Labs  Lab 02/24/23 1605 02/25/23 0211  NA 136 137  K 3.8 3.8  CL 105 108  CO2 23 21*  GLUCOSE 97 98  BUN 10 12  CREATININE 1.04 1.14  CALCIUM 9.0 8.8*    GFR: Estimated Creatinine Clearance: 98.2 mL/min (by C-G formula based on SCr of 1.14 mg/dL).  Liver Function Tests: No results for input(s): "AST", "ALT", "ALKPHOS", "BILITOT", "PROT", "ALBUMIN" in the last 168 hours.  CBG: No results for input(s): "GLUCAP" in the last 168 hours.   No results found for this or any previous visit (from the past 240 hour(s)).       Radiology Studies: X-ray chest PA and lateral  Result Date: 02/25/2023 CLINICAL DATA:  [id number].  Right pneumothorax follow-up. EXAM: CHEST - 2 VIEW COMPARISON:  Chest CT with contrast yesterday at 8:15 p.m. FINDINGS: 5:05 a.m. There is a right apicolateral pneumothorax estimated about 20% of the chest volume extending into the paramediastinal area. There has been no visible change since yesterday's  CT. The lungs are otherwise inflated and clear. The cardiomediastinal silhouette and vasculature are normal. Thoracic cage is intact. IMPRESSION: Right apicolateral pneumothorax estimated about 20% of the chest volume extending into the paramediastinal area. No visible change since yesterday's CT. There is no appreciable change compared with yesterday's PA and lateral chest at 2:57 p.m. either. Electronically Signed   By: Almira Bar M.D.   On: 02/25/2023 05:53   CT CHEST W CONTRAST  Result Date: 02/24/2023 CLINICAL DATA:  Pneumothorax EXAM: CT CHEST WITH CONTRAST TECHNIQUE: Multidetector CT imaging of the chest was performed during intravenous contrast  administration. RADIATION DOSE REDUCTION: This exam was performed according to the departmental dose-optimization program which includes automated exposure control, adjustment of the mA and/or kV according to patient size and/or use of iterative reconstruction technique. CONTRAST:  75mL OMNIPAQUE IOHEXOL 350 MG/ML SOLN COMPARISON:  None Available. FINDINGS: Cardiovascular: No significant vascular findings. Normal heart size. No pericardial effusion. Mediastinum/Nodes: No enlarged mediastinal, hilar, or axillary lymph nodes. Thyroid gland, trachea, and esophagus demonstrate no significant findings. Lungs/Pleura: Small right pneumothorax. Lungs are clear. No pleural effusion. No pneumothorax on the left. No central obstructing lesion. Upper Abdomen: No acute abnormality. Musculoskeletal: No chest wall abnormality. No acute or significant osseous findings. IMPRESSION: 1. Small right pneumothorax. No radiographic evidence of tension physiology. Electronically Signed   By: Helyn Numbers M.D.   On: 02/24/2023 20:53   DG Chest 2 View  Result Date: 02/24/2023 CLINICAL DATA:  Pneumothorax EXAM: CHEST - 2 VIEW COMPARISON:  12/18/2013 FINDINGS: Small right apical pneumothorax measuring 17 mm from visceral to parietal pleura. Otherwise no consolidation, pleural effusion or edema. No left-sided pneumothorax. Normal cardiopericardial silhouette. Curvature of the thoracolumbar spine. IMPRESSION: Small right apical pneumothorax. Critical Value/emergent results were called by telephone at the time of interpretation on 02/24/2023 at 1:22 pm to provider Dr. Doran Durand, who verbally acknowledged these results. Electronically Signed   By: Karen Kays M.D.   On: 02/24/2023 16:46        Scheduled Meds:  enoxaparin (LOVENOX) injection  40 mg Subcutaneous Q24H   sodium chloride flush  3 mL Intravenous Q12H   Continuous Infusions:  sodium chloride       LOS: 0 days      Huey Bienenstock, MD [facility]   To  contact the attending provider between 7A-7P or the covering provider during after hours 7P-7A, please log into the web site [email or web address] and access using universal [place] password for that web site. If you do not have the password, please call the hospital operator.  02/25/2023, 2:29 PM

## 2023-02-25 NOTE — Consult Note (Signed)
NAMEJumaane Scott, MRN:  [id number], DOB:  1995/10/18, LOS: 0 ADMISSION DATE:  02/24/2023, CONSULTATION DATE: 02/25/2023 REFERRING MD: [facility], CHIEF COMPLAINT: Right pneumothorax  History of Present Illness:  27 year old healthy male he was at work developed right-sided chest pain shortness of breath and was sent for evaluation right-sided chest pain.  X-ray revealed 18% right pneumothorax confirmed with CT scan of the chest.  Follow-up chest x-ray shows continued 10% pneumothorax without extension at this time.  Pulmonary critical care asked to evaluate we will continue nitrogen washout recheck chest x-ray and give consideration to chest tube if needed.  Pertinent  Medical History  History reviewed. No pertinent past medical history.   Significant Hospital Events: Including procedures, antibiotic start and stop dates in addition to other pertinent events   Admitted with right spontaneous pneumothorax approximately 10%  Interim History / Subjective:  No acute distress at rest  Objective   Blood pressure (!) 128/56, pulse 86, temperature 97.9 F (36.6 C), temperature source Oral, resp. rate 18, height 5\' 9"  (1.753 m), weight 81.1 kg, SpO2 98 %.        Intake/Output Summary (Last 24 hours) at 02/25/2023 1246 Last data filed at 02/25/2023 1200 Gross per 24 hour  Intake 240 ml  Output 0 ml  Net 240 ml   Filed Weights   02/24/23 1442 02/24/23 1850  Weight: 83.9 kg 81.1 kg    Examination: General: Well-nourished well-developed young male no acute distress HENT: No JVD or lymphadenopathy Lungs: Clear to auscultation Cardiovascular: Heart sounds are regular Abdomen: Soft nontender positive bowel sounds Extremities: Warm dry without edema Neuro: Grossly intact without focal defect  GU: voids without problem  Resolved Hospital Problem list     Assessment & Plan:  Spontaneous right-sided pneumothorax dental serial chest x-rays and CT scan remains unchanged.  He is in no acute  distress at rest. Continue O2 for nitrogen washout Serial chest x-ray May need chest tube in the near future  Best Practice (right click and "Reselect all SmartList Selections" daily)   Per primary  Labs   CBC: Recent Labs  Lab 02/24/23 1605  WBC 6.8  NEUTROABS 3.3  HGB 14.8  HCT 41.7  MCV 82.2  PLT 227    Basic Metabolic Panel: Recent Labs  Lab 02/24/23 1605 02/25/23 0211  NA 136 137  K 3.8 3.8  CL 105 108  CO2 23 21*  GLUCOSE 97 98  BUN 10 12  CREATININE 1.04 1.14  CALCIUM 9.0 8.8*   GFR: Estimated Creatinine Clearance: 98.2 mL/min (by C-G formula based on SCr of 1.14 mg/dL). Recent Labs  Lab 02/24/23 1605  WBC 6.8    Liver Function Tests: No results for input(s): "AST", "ALT", "ALKPHOS", "BILITOT", "PROT", "ALBUMIN" in the last 168 hours. No results for input(s): "LIPASE", "AMYLASE" in the last 168 hours. No results for input(s): "AMMONIA" in the last 168 hours.  ABG No results found for: "PHART", "PCO2ART", "PO2ART", "HCO3", "TCO2", "ACIDBASEDEF", "O2SAT"   Coagulation Profile: No results for input(s): "INR", "PROTIME" in the last 168 hours.  Cardiac Enzymes: No results for input(s): "CKTOTAL", "CKMB", "CKMBINDEX", "TROPONINI" in the last 168 hours.  HbA1C: No results found for: "HGBA1C"  CBG: No results for input(s): "GLUCAP" in the last 168 hours.  Review of Systems:   10 point review of system taken, please see HPI for positives and negatives.   Past Medical History:  He,  has no past medical history on file.   Surgical History:  Past Surgical History:  Procedure Laterality Date   APPENDECTOMY       Social History:   reports that he has never smoked. He has never used smokeless tobacco. He reports current drug use. Drug: Marijuana. He reports that he does not drink alcohol.   Family History:  His family history is not on file.   Allergies No Known Allergies   Home Medications  Prior to Admission medications   Medication  Sig Start Date End Date Taking? Authorizing Provider  acetaminophen (TYLENOL) 500 MG tablet Take 500 mg by mouth 2 (two) times daily as needed for moderate pain.   Yes [provider]     Critical care time: Elizebeth Brooking Cayden Granholm ACNP Acute Care Nurse Practitioner [facility] Pulmonary/Critical Care Please consult Amion 02/25/2023, 12:46 PM

## 2023-02-25 NOTE — TOC CM/SW Note (Signed)
Transition of Care [facility]) - Inpatient Brief Assessment   Patient Details  Name: Alex Scott MRN: [id number] Date of Birth: 05-29-1996  Transition of Care [facility]) CM/SW Contact:    [name], RN Phone Number: 02/25/2023, 9:50 AM   Clinical Narrative:  Patient is from home and independent.  MATCH letter completed for this uninsured patient. PCP has also been requested TOC will continue to follow patient for any additional discharge needs      Transition of Care Asessment: Insurance and Status: Insurance coverage has been reviewed (uninsured) Patient has primary care physician: No (PCP requested) Home environment has been reviewed: From Home Prior level of function:: independent Prior/Current Home Services: No current home services Social Determinants of Health Reivew: SDOH reviewed needs interventions (Uninsured- MATCH letter completed / PCP requested) Readmission risk has been reviewed: Yes (N/A) Transition of care needs: transition of care needs identified, TOC will continue to follow

## 2023-02-26 ENCOUNTER — Observation Stay (HOSPITAL_COMMUNITY): Payer: Self-pay

## 2023-02-26 DIAGNOSIS — J939 Pneumothorax, unspecified: Secondary | ICD-10-CM

## 2023-02-26 NOTE — TOC Transition Note (Signed)
Transition of Care [facility]) - CM/SW Discharge Note   Patient Details  Name: Alex Scott MRN: [id number] Date of Birth: July 06, 1996  [facility]) CM/SW Contact:  [name], RN Phone Number: 02/26/2023, 11:45 AM   Clinical Narrative:     Patient will DC to home with Mom today.  An appointment that was made for a new PCP has been cancelled- Patient states he does have a PCP - Micael Hampshire at [facility].  He could not remember her name at admission so it was not listed.  No additional TOC needs            Patient Goals and CMS Choice      Discharge Placement                         Discharge Plan and Services Additional resources added to the After Visit Summary for                                       Social Determinants of Health (SDOH) Interventions SDOH Screenings   Food Insecurity: No Food Insecurity (02/24/2023)  Housing: Low Risk  (02/24/2023)  Transportation Needs: No Transportation Needs (02/24/2023)  Utilities: Not At Risk (02/24/2023)  Financial Resource Strain: Medium Risk (02/25/2023)  Tobacco Use: Low Risk  (02/24/2023)     Readmission Risk Interventions     No data to display

## 2023-02-26 NOTE — Discharge Instructions (Signed)
Follow with Primary MD in 7 days   Get  2 view Chest X ray checked  by Primary MD next visit.    Activity: As tolerated with Full fall precautions use walker/cane & assistance as needed   Disposition Home    Diet: Regular Diet  On your next visit with your primary care physician please Get Medicines reviewed and adjusted.   Please request your Prim.MD to go over all Hospital Tests and Procedure/Radiological results at the follow up, please get all Hospital records sent to your Prim MD by signing hospital release before you go home.   If you experience worsening of your admission symptoms, develop shortness of breath, life threatening emergency, suicidal or homicidal thoughts you must seek medical attention immediately by calling 911 or calling your MD immediately  if symptoms less severe.  You Must read complete instructions/literature along with all the possible adverse reactions/side effects for all the Medicines you take and that have been prescribed to you. Take any new Medicines after you have completely understood and accpet all the possible adverse reactions/side effects.   Do not drive, operating heavy machinery, perform activities at heights, swimming or participation in water activities or provide baby sitting services if your were admitted for syncope or siezures until you have seen by Primary MD or a Neurologist and advised to do so again.  Do not drive when taking Pain medications.    Do not take more than prescribed Pain, Sleep and Anxiety Medications  Special Instructions: If you have smoked or chewed Tobacco  in the last 2 yrs please stop smoking, stop any regular Alcohol  and or any Recreational drug use.  Wear Seat belts while driving.   Please note  You were cared for by a hospitalist during your hospital stay. If you have any questions about your discharge medications or the care you received while you were in the hospital after you are discharged, you can  call the unit and asked to speak with the hospitalist on call if the hospitalist that took care of you is not available. Once you are discharged, your primary care physician will handle any further medical issues. Please note that NO REFILLS for any discharge medications will be authorized once you are discharged, as it is imperative that you return to your primary care physician (or establish a relationship with a primary care physician if you do not have one) for your aftercare needs so that they can reassess your need for medications and monitor your lab values.

## 2023-02-26 NOTE — Progress Notes (Signed)
NAMETurrell Scott, MRN:  [id number], DOB:  Jun 07, 1996, LOS: 0 ADMISSION DATE:  02/24/2023, CONSULTATION DATE: 02/25/2023 REFERRING MD: [facility], CHIEF COMPLAINT: Right pneumothorax  History of Present Illness:  27 year old healthy male he was at work developed right-sided chest pain shortness of breath and was sent for evaluation right-sided chest pain.  X-ray revealed 18% right pneumothorax confirmed with CT scan of the chest.  Follow-up chest x-ray shows continued 10% pneumothorax without extension at this time.  Pulmonary critical care asked to evaluate we will continue nitrogen washout recheck chest x-ray and give consideration to chest tube if needed.  Pertinent  Medical History  History reviewed. No pertinent past medical history.   Significant Hospital Events: Including procedures, antibiotic start and stop dates in addition to other pertinent events   Admitted with right spontaneous pneumothorax approximately 10%  Interim History / Subjective:  No acute distress at rest  Objective   Blood pressure 122/82, pulse (!) 51, temperature 97.8 F (36.6 C), temperature source Oral, resp. rate 16, height 5\' 9"  (1.753 m), weight 81.1 kg, SpO2 98 %.        Intake/Output Summary (Last 24 hours) at 02/26/2023 1005 Last data filed at 02/26/2023 0700 Gross per 24 hour  Intake 480 ml  Output 0 ml  Net 480 ml   Filed Weights   02/24/23 1442 02/24/23 1850  Weight: 83.9 kg 81.1 kg    Examination: General well-nourished well-developed male no acute distress Chest is clear to auscultation Cardiac heart sounds are regular Abdomen soft nontender Extremities warm without edema acute  Resolved Hospital Problem list     Assessment & Plan:  Spontaneous right-sided pneumothorax dental serial chest x-rays and CT scan remains unchanged.  He is in no acute distress at rest. Discontinue oxygen Pneumothorax on chest x-ray l no significant change Can be discharged with a follow-up chest x-ray in  the near future   Best Practice (right click and "Reselect all SmartList Selections" daily)   Per primary  Labs   CBC: Recent Labs  Lab 02/24/23 1605  WBC 6.8  NEUTROABS 3.3  HGB 14.8  HCT 41.7  MCV 82.2  PLT 227    Basic Metabolic Panel: Recent Labs  Lab 02/24/23 1605 02/25/23 0211  NA 136 137  K 3.8 3.8  CL 105 108  CO2 23 21*  GLUCOSE 97 98  BUN 10 12  CREATININE 1.04 1.14  CALCIUM 9.0 8.8*   GFR: Estimated Creatinine Clearance: 98.2 mL/min (by C-G formula based on SCr of 1.14 mg/dL). Recent Labs  Lab 02/24/23 1605  WBC 6.8    Liver Function Tests: No results for input(s): "AST", "ALT", "ALKPHOS", "BILITOT", "PROT", "ALBUMIN" in the last 168 hours. No results for input(s): "LIPASE", "AMYLASE" in the last 168 hours. No results for input(s): "AMMONIA" in the last 168 hours.  ABG No results found for: "PHART", "PCO2ART", "PO2ART", "HCO3", "TCO2", "ACIDBASEDEF", "O2SAT"   Coagulation Profile: No results for input(s): "INR", "PROTIME" in the last 168 hours.  Cardiac Enzymes: No results for input(s): "CKTOTAL", "CKMB", "CKMBINDEX", "TROPONINI" in the last 168 hours.  HbA1C: No results found for: "HGBA1C"  CBG: No results for input(s): "GLUCAP" in the last 168 hours.  Review of Systems:   10 point review of system taken, please see HPI for positives and negatives.   Past Medical History:  He,  has no past medical history on file.   Surgical History:   Past Surgical History:  Procedure Laterality Date   APPENDECTOMY  Social History:   reports that he has never smoked. He has never used smokeless tobacco. He reports current drug use. Drug: Marijuana. He reports that he does not drink alcohol.   Family History:  His family history is not on file.   Allergies No Known Allergies   Home Medications  Prior to Admission medications   Medication Sig Start Date End Date Taking? Authorizing Provider  acetaminophen (TYLENOL) 500 MG tablet  Take 500 mg by mouth 2 (two) times daily as needed for moderate pain.   Yes [provider]     Critical care time: Elizebeth Brooking Chelise Hanger ACNP Acute Care Nurse Practitioner [facility] Pulmonary/Critical Care Please consult Amion 02/26/2023, 10:05 AM

## 2023-02-26 NOTE — Discharge Summary (Signed)
Physician Discharge Summary  Alex Scott ZOX:[id number] DOB: November 16, 1995 DOA: 02/24/2023  PCP: Pcp, No  Admit date: 02/24/2023 Discharge date: 02/26/2023    Recommendations for Outpatient Follow-up:  Follow up with PCP in 1 week to repeat chest xray.   Discharge Condition: (Stable) Diet recommendation: Regular  Brief/Interim Summary:    Spontaneous right-sided pneumothorax , serial chest x-rays and CT scan remains unchanged. He is in no acute distress at rest.    Alex Scott a healthy 27 year old had the onset of right sided chest pain 02/23/23. He denies any hard coughing, physical strain. He vapes and smoke marijuana. He went to [facility], where CXR revealed a small apical pneumothorax on the right. He was referred to [facility] for further evaluation.  X-ray revealed 18% right pneumothorax confirmed with CT scan of the chest. Follow-up chest x-ray shows continued 10% pneumothorax without extension . Pulmonary critical care were consulted, they recommended to continue nitrogen washout, he was kept on NRB,  repeat x-ray this morning showing no significant change, per pulmonary and critical care service patient can be discharged with a follow-up chest x-ray as an outpatient near future      Discharge Diagnoses:  Principal Problem:   Pneumothorax on right Active Problems:   Pneumothorax    Discharge Instructions  Discharge Instructions     Diet - low sodium heart healthy   Complete by: As directed    Discharge instructions   Complete by: As directed    Follow with Primary MD in 7 days   Get  2 view Chest X ray checked  by Primary MD next visit.    Activity: As tolerated with Full fall precautions use walker/cane & assistance as needed   Disposition Home    Diet: Regular Diet  On your next visit with your primary care physician please Get Medicines reviewed and adjusted.   Please request your Prim.MD to go over all Hospital Tests and  Procedure/Radiological results at the follow up, please get all Hospital records sent to your Prim MD by signing hospital release before you go home.   If you experience worsening of your admission symptoms, develop shortness of breath, life threatening emergency, suicidal or homicidal thoughts you must seek medical attention immediately by calling 911 or calling your MD immediately  if symptoms less severe.  You Must read complete instructions/literature along with all the possible adverse reactions/side effects for all the Medicines you take and that have been prescribed to you. Take any new Medicines after you have completely understood and accpet all the possible adverse reactions/side effects.   Do not drive, operating heavy machinery, perform activities at heights, swimming or participation in water activities or provide baby sitting services if your were admitted for syncope or siezures until you have seen by Primary MD or a Neurologist and advised to do so again.  Do not drive when taking Pain medications.    Do not take more than prescribed Pain, Sleep and Anxiety Medications  Special Instructions: If you have smoked or chewed Tobacco  in the last 2 yrs please stop smoking, stop any regular Alcohol  and or any Recreational drug use.  Wear Seat belts while driving.   Please note  You were cared for by a hospitalist during your hospital stay. If you have any questions about your discharge medications or the care you received while you were in the hospital after you are discharged, you can call the unit and asked to speak with the hospitalist on  call if the hospitalist that took care of you is not available. Once you are discharged, your primary care physician will handle any further medical issues. Please note that NO REFILLS for any discharge medications will be authorized once you are discharged, as it is imperative that you return to your primary care physician (or establish a  relationship with a primary care physician if you do not have one) for your aftercare needs so that they can reassess your need for medications and monitor your lab values.   Increase activity slowly   Complete by: As directed       Allergies as of 02/26/2023   No Known Allergies      Medication List     TAKE these medications    acetaminophen 500 MG tablet Commonly known as: TYLENOL Take 500 mg by mouth 2 (two) times daily as needed for moderate pain.        No Known Allergies  Consultations: PCCM   Procedures/Studies: DG CHEST PORT 1 VIEW  Result Date: 02/26/2023 CLINICAL DATA:  Pneumothorax. EXAM: PORTABLE CHEST 1 VIEW COMPARISON:  Chest radiographs 02/25/2023 (multiple studies, CT chest 02/24/2023,, chest radiographs 02/24/2023 and 12/19/2022 FINDINGS: Small right apical pneumothorax now measures approximately 10 mm, compared to approximately 12 mm on 02/18/2023 at 1318 hours and 14 mm on 02/25/2023 at 5:04 hours most recent comparison radiographs when measured in a similar manner. No left pneumothorax. Lungs are clear. Cardiac silhouette and mediastinal contours within normal limits. Normal regional bones. IMPRESSION: Continued mild improvement in small right apical pneumothorax. Electronically Signed   By: Neita Garnet M.D.   On: 02/26/2023 10:27   DG CHEST PORT 1 VIEW  Result Date: 02/25/2023 CLINICAL DATA:  Right pneumothorax EXAM: PORTABLE CHEST 1 VIEW COMPARISON:  Previous studies including the examination done earlier today FINDINGS: There is interval decrease in size of right pneumothorax in the periphery of right upper lung field. There is small residual right pneumothorax. Cardiac size is within normal limits. Lung fields are clear of any infiltrates or pulmonary edema. There is no pleural effusion or pneumothorax. IMPRESSION: There is small right pneumothorax with interval decrease in size. Electronically Signed   By: Ernie Avena M.D.   On: 02/25/2023 15:42    X-ray chest PA and lateral  Result Date: 02/25/2023 CLINICAL DATA:  [id number].  Right pneumothorax follow-up. EXAM: CHEST - 2 VIEW COMPARISON:  Chest CT with contrast yesterday at 8:15 p.m. FINDINGS: 5:05 a.m. There is a right apicolateral pneumothorax estimated about 20% of the chest volume extending into the paramediastinal area. There has been no visible change since yesterday's CT. The lungs are otherwise inflated and clear. The cardiomediastinal silhouette and vasculature are normal. Thoracic cage is intact. IMPRESSION: Right apicolateral pneumothorax estimated about 20% of the chest volume extending into the paramediastinal area. No visible change since yesterday's CT. There is no appreciable change compared with yesterday's PA and lateral chest at 2:57 p.m. either. Electronically Signed   By: Almira Bar M.D.   On: 02/25/2023 05:53   CT CHEST W CONTRAST  Result Date: 02/24/2023 CLINICAL DATA:  Pneumothorax EXAM: CT CHEST WITH CONTRAST TECHNIQUE: Multidetector CT imaging of the chest was performed during intravenous contrast administration. RADIATION DOSE REDUCTION: This exam was performed according to the departmental dose-optimization program which includes automated exposure control, adjustment of the mA and/or kV according to patient size and/or use of iterative reconstruction technique. CONTRAST:  75mL OMNIPAQUE IOHEXOL 350 MG/ML SOLN COMPARISON:  None Available. FINDINGS: Cardiovascular: No significant  vascular findings. Normal heart size. No pericardial effusion. Mediastinum/Nodes: No enlarged mediastinal, hilar, or axillary lymph nodes. Thyroid gland, trachea, and esophagus demonstrate no significant findings. Lungs/Pleura: Small right pneumothorax. Lungs are clear. No pleural effusion. No pneumothorax on the left. No central obstructing lesion. Upper Abdomen: No acute abnormality. Musculoskeletal: No chest wall abnormality. No acute or significant osseous findings. IMPRESSION: 1. Small right  pneumothorax. No radiographic evidence of tension physiology. Electronically Signed   By: Helyn Numbers M.D.   On: 02/24/2023 20:53   DG Chest 2 View  Result Date: 02/24/2023 CLINICAL DATA:  Pneumothorax EXAM: CHEST - 2 VIEW COMPARISON:  12/18/2013 FINDINGS: Small right apical pneumothorax measuring 17 mm from visceral to parietal pleura. Otherwise no consolidation, pleural effusion or edema. No left-sided pneumothorax. Normal cardiopericardial silhouette. Curvature of the thoracolumbar spine. IMPRESSION: Small right apical pneumothorax. Critical Value/emergent results were called by telephone at the time of interpretation on 02/24/2023 at 1:22 pm to provider Dr. Doran Durand, who verbally acknowledged these results. Electronically Signed   By: Karen Kays M.D.   On: 02/24/2023 16:46      Subjective: No significant events overnight, he denies any complaints today,  Discharge Exam: Vitals:   02/25/23 2313 02/26/23 0837  BP: 115/62 122/82  Pulse:    Resp: 18 16  Temp: 98.3 F (36.8 C) 97.8 F (36.6 C)  SpO2: 98%    Vitals:   02/25/23 1210 02/25/23 1620 02/25/23 2313 02/26/23 0837  BP:  115/60 115/62 122/82  Pulse:  (!) 51    Resp:  16 18 16   Temp:  98.5 F (36.9 C) 98.3 F (36.8 C) 97.8 F (36.6 C)  TempSrc: Oral Oral Oral Oral  SpO2: 98% 98% 98%   Weight:      Height:        General: Pt is alert, awake, not in acute distress Cardiovascular: RRR, S1/S2 +, no rubs, no gallops Respiratory: CTA bilaterally, no wheezing, no rhonchi Abdominal: Soft, NT, ND, bowel sounds + Extremities: no edema, no cyanosis    The results of significant diagnostics from this hospitalization (including imaging, microbiology, ancillary and laboratory) are listed below for reference.     Microbiology: No results found for this or any previous visit (from the past 240 hour(s)).   Labs: BNP (last 3 results) No results for input(s): "BNP" in the last 8760 hours. Basic Metabolic Panel: Recent  Labs  Lab 02/24/23 1605 02/25/23 0211  NA 136 137  K 3.8 3.8  CL 105 108  CO2 23 21*  GLUCOSE 97 98  BUN 10 12  CREATININE 1.04 1.14  CALCIUM 9.0 8.8*   Liver Function Tests: No results for input(s): "AST", "ALT", "ALKPHOS", "BILITOT", "PROT", "ALBUMIN" in the last 168 hours. No results for input(s): "LIPASE", "AMYLASE" in the last 168 hours. No results for input(s): "AMMONIA" in the last 168 hours. CBC: Recent Labs  Lab 02/24/23 1605  WBC 6.8  NEUTROABS 3.3  HGB 14.8  HCT 41.7  MCV 82.2  PLT 227   Cardiac Enzymes: No results for input(s): "CKTOTAL", "CKMB", "CKMBINDEX", "TROPONINI" in the last 168 hours. BNP: Invalid input(s): "POCBNP" CBG: No results for input(s): "GLUCAP" in the last 168 hours. D-Dimer No results for input(s): "DDIMER" in the last 72 hours. Hgb A1c No results for input(s): "HGBA1C" in the last 72 hours. Lipid Profile No results for input(s): "CHOL", "HDL", "LDLCALC", "TRIG", "CHOLHDL", "LDLDIRECT" in the last 72 hours. Thyroid function studies No results for input(s): "TSH", "T4TOTAL", "T3FREE", "THYROIDAB" in the last  72 hours.  Invalid input(s): "FREET3" Anemia work up No results for input(s): "VITAMINB12", "FOLATE", "FERRITIN", "TIBC", "IRON", "RETICCTPCT" in the last 72 hours. Urinalysis No results found for: "COLORURINE", "APPEARANCEUR", "LABSPEC", "PHURINE", "GLUCOSEU", "HGBUR", "BILIRUBINUR", "KETONESUR", "PROTEINUR", "UROBILINOGEN", "NITRITE", "LEUKOCYTESUR" Sepsis Labs Recent Labs  Lab 02/24/23 1605  WBC 6.8   Microbiology No results found for this or any previous visit (from the past 240 hour(s)).   Time coordinating discharge: Over 30 minutes  SIGNED:   Huey Bienenstock, MD  [facility] 02/26/2023, 12:08 PM Pager   If 7PM-7AM, please contact night-coverage [email or web address]

## 2023-03-10 ENCOUNTER — Inpatient Hospital Stay (INDEPENDENT_AMBULATORY_CARE_PROVIDER_SITE_OTHER): Payer: Self-pay | Admitting: Primary Care

## 2023-05-12 ENCOUNTER — Inpatient Hospital Stay (HOSPITAL_BASED_OUTPATIENT_CLINIC_OR_DEPARTMENT_OTHER)
Admission: EM | Admit: 2023-05-12 | Discharge: 2023-05-16 | DRG: 165 | Disposition: A | Discharge status: Home / Self Care | Payer: Self-pay | Attending: Thoracic Surgery (Cardiothoracic Vascular Surgery) | Admitting: Thoracic Surgery (Cardiothoracic Vascular Surgery)

## 2023-05-12 ENCOUNTER — Encounter (HOSPITAL_BASED_OUTPATIENT_CLINIC_OR_DEPARTMENT_OTHER): Payer: Self-pay

## 2023-05-12 ENCOUNTER — Other Ambulatory Visit: Payer: Self-pay

## 2023-05-12 ENCOUNTER — Emergency Department (HOSPITAL_BASED_OUTPATIENT_CLINIC_OR_DEPARTMENT_OTHER): Payer: Self-pay

## 2023-05-12 DIAGNOSIS — J939 Pneumothorax, unspecified: Secondary | ICD-10-CM | POA: Diagnosis present

## 2023-05-12 DIAGNOSIS — Z9889 Other specified postprocedural states: Secondary | ICD-10-CM

## 2023-05-12 DIAGNOSIS — F1210 Cannabis abuse, uncomplicated: Secondary | ICD-10-CM | POA: Diagnosis present

## 2023-05-12 DIAGNOSIS — Z87891 Personal history of nicotine dependence: Secondary | ICD-10-CM

## 2023-05-12 DIAGNOSIS — J9383 Other pneumothorax: Principal | ICD-10-CM | POA: Diagnosis present

## 2023-05-12 NOTE — ED Triage Notes (Addendum)
Pt states he had a spontaneous pneumothorax a month ago and is having pain just like he did then. Rt. Sided chest pain/rib area  +SHOB

## 2023-05-13 ENCOUNTER — Inpatient Hospital Stay (HOSPITAL_COMMUNITY): Payer: Self-pay

## 2023-05-13 DIAGNOSIS — J939 Pneumothorax, unspecified: Secondary | ICD-10-CM

## 2023-05-13 DIAGNOSIS — J9311 Primary spontaneous pneumothorax: Secondary | ICD-10-CM

## 2023-05-13 LAB — CBC
HCT: 42.4 % (ref 39.0–52.0)
Hemoglobin: 15.2 g/dL (ref 13.0–17.0)
MCH: 29.7 pg (ref 26.0–34.0)
MCHC: 35.8 g/dL (ref 30.0–36.0)
MCV: 83 fL (ref 80.0–100.0)
Platelets: 217 10*3/uL (ref 150–400)
RBC: 5.11 MIL/uL (ref 4.22–5.81)
RDW: 12.7 % (ref 11.5–15.5)
WBC: 5.8 10*3/uL (ref 4.0–10.5)
nRBC: 0 % (ref 0.0–0.2)

## 2023-05-13 LAB — CBC WITH DIFFERENTIAL/PLATELET
Abs Immature Granulocytes: 0.03 10*3/uL (ref 0.00–0.07)
Basophils Absolute: 0 10*3/uL (ref 0.0–0.1)
Basophils Relative: 0 %
Eosinophils Absolute: 0 10*3/uL (ref 0.0–0.5)
Eosinophils Relative: 0 %
HCT: 40.2 % (ref 39.0–52.0)
Hemoglobin: 14.8 g/dL (ref 13.0–17.0)
Immature Granulocytes: 0 %
Lymphocytes Relative: 19 %
Lymphs Abs: 1.9 10*3/uL (ref 0.7–4.0)
MCH: 29.5 pg (ref 26.0–34.0)
MCHC: 36.8 g/dL — ABNORMAL HIGH (ref 30.0–36.0)
MCV: 80.2 fL (ref 80.0–100.0)
Monocytes Absolute: 0.7 10*3/uL (ref 0.1–1.0)
Monocytes Relative: 7 %
Neutro Abs: 7.2 10*3/uL (ref 1.7–7.7)
Neutrophils Relative %: 74 %
Platelets: 209 10*3/uL (ref 150–400)
RBC: 5.01 MIL/uL (ref 4.22–5.81)
RDW: 12.6 % (ref 11.5–15.5)
WBC: 9.8 10*3/uL (ref 4.0–10.5)
nRBC: 0 % (ref 0.0–0.2)

## 2023-05-13 LAB — BASIC METABOLIC PANEL
Anion gap: 11 (ref 5–15)
BUN: 13 mg/dL (ref 6–20)
CO2: 23 mmol/L (ref 22–32)
Calcium: 9.2 mg/dL (ref 8.9–10.3)
Chloride: 99 mmol/L (ref 98–111)
Creatinine, Ser: 0.98 mg/dL (ref 0.61–1.24)
GFR, Estimated: >60 mL/min (ref >60)
Glucose, Bld: 101 mg/dL — ABNORMAL HIGH (ref 70–99)
Potassium: 3.4 mmol/L — ABNORMAL LOW (ref 3.5–5.1)
Sodium: 133 mmol/L — ABNORMAL LOW (ref 135–145)

## 2023-05-13 LAB — TYPE AND SCREEN
ABO/RH(D): B POS
Antibody Screen: NEGATIVE

## 2023-05-13 LAB — ABO/RH: ABO/RH(D): B POS

## 2023-05-13 MED ORDER — OXYCODONE HCL 5 MG PO TABS: 5.0000 mg | ORAL_TABLET | Freq: Four times a day (QID) | ORAL | Status: DC | PRN

## 2023-05-13 MED ORDER — POTASSIUM CHLORIDE CRYS ER 20 MEQ PO TBCR
40.0000 meq | EXTENDED_RELEASE_TABLET | Freq: Once | ORAL | Status: AC
  Administered 2023-05-13: 40 meq via ORAL
  Filled 2023-05-13: qty 2

## 2023-05-13 MED ORDER — MELATONIN 3 MG PO TABS: 3.0000 mg | ORAL_TABLET | Freq: Every evening | ORAL | Status: DC | PRN

## 2023-05-13 MED ORDER — NALOXONE HCL 0.4 MG/ML IJ SOLN: 0.4000 mg | INTRAMUSCULAR | Status: DC | PRN

## 2023-05-13 MED ORDER — ACETAMINOPHEN 650 MG RE SUPP: 650.0000 mg | Freq: Four times a day (QID) | RECTAL | Status: DC | PRN

## 2023-05-13 MED ORDER — ONDANSETRON HCL 4 MG/2ML IJ SOLN: 4.0000 mg | Freq: Four times a day (QID) | INTRAMUSCULAR | Status: DC | PRN

## 2023-05-13 MED ORDER — ACETAMINOPHEN 325 MG PO TABS: 650.0000 mg | ORAL_TABLET | Freq: Four times a day (QID) | ORAL | Status: DC | PRN

## 2023-05-13 MED ORDER — FENTANYL CITRATE PF 50 MCG/ML IJ SOSY: 25.0000 ug | PREFILLED_SYRINGE | INTRAMUSCULAR | Status: DC | PRN

## 2023-05-13 MED ORDER — CEFAZOLIN SODIUM-DEXTROSE 2-4 GM/100ML-% IV SOLN
2.0000 g | INTRAVENOUS | Status: AC
  Administered 2023-05-14: 2 g via INTRAVENOUS
  Filled 2023-05-13: qty 100

## 2023-05-13 NOTE — Progress Notes (Signed)
Plan of Care Note for accepted transfer   Patient: Alex Scott MRN: [id number]   DOA: 05/12/2023  Facility requesting transfer: [facility]   Requesting Provider: Dr. Bernette Mayers   Reason for transfer: Pneumothorax   Facility course: 27 yr old male with hx of spontaneous pneumothorax in June 2024 presents with right-sided chest pain and SOB.   CXR demonstrates right apical pneumothorax.   PCCM was consulted by ED physician, recommended hospitalist admission and supplemental O2, and they will see him in the morning.   Plan of care: The patient is accepted for admission to Telemetry unit, at [facility].   Author: Briscoe Deutscher, MD 05/13/2023  Check [email or web address] for on-call coverage.  Nursing staff, Please call [facility] Admits & Consults System-Wide number on Amion as soon as patient's arrival, so appropriate admitting provider can evaluate the pt.

## 2023-05-13 NOTE — TOC Initial Note (Signed)
Transition of Care (TOC) - Initial/Assessment Note   Spoke to patient at bedside.   Patient from home with parents. Confirmed address and phone number. Prior to admission patient independent , no DME.   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   Patient does not have PCP . Offered to schedule an appointment with a [facility] closer to discharge. Patient would like to think about it first.   Changed pharmacy to [facility] . At discharge prescriptions will be sent to [facility]. TOC Pharmacy will call patient with cost . If patient cannot afford will see if patient qualifies for [facility] program   Patient Details  Name: Alex Scott MRN: [id number] Date of Birth: [birth year]  Transition of Care [facility]) CM/SW Contact:    Kingsley Plan, RN Phone Number: 05/13/2023, 9:59 AM  Clinical Narrative:                   Expected Discharge Plan: Home/Self Care Barriers to Discharge: Continued Medical Work up   Patient Goals and CMS Choice Patient states their goals for this hospitalization and ongoing recovery are:: to return to home   Choice offered to / list presented to : NA      Expected Discharge Plan and Services In-house Referral: Artist, PCP / [facility] Discharge Planning Services: CM Consult Post Acute Care Choice: NA Living arrangements for the past 2 months: Single Family Home                 DME Arranged: N/A DME Agency: NA       HH Arranged: NA          Prior Living Arrangements/Services Living arrangements for the past 2 months: Single Family Home Lives with:: Parents Patient language and need for interpreter reviewed:: Yes Do you feel safe going back to the place where you live?: Yes      Need for Family Participation in Patient Care: Yes (Comment) Care giver support system in place?: Yes (comment)   Criminal Activity/Legal Involvement Pertinent to Current Situation/Hospitalization: No - Comment as  needed  Activities of Daily Living      Permission Sought/Granted   Permission granted to share information with : No              Emotional Assessment Appearance:: Appears stated age Attitude/Demeanor/Rapport: Engaged Affect (typically observed): Accepting Orientation: : Oriented to Self, Oriented to Place, Oriented to  Time, Oriented to Situation Alcohol / Substance Use: Not Applicable Psych Involvement: No (comment)  Admission diagnosis:  Pneumothorax on right [J93.9] 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 [place] [address] Phone: [phone] Fax: [phone]     Social Determinants of Health (SDOH) Social History: SDOH Screenings   Food Insecurity: No Food Insecurity (02/24/2023)  Housing: Low Risk  (02/24/2023)  Transportation Needs: No Transportation Needs (02/24/2023)  Utilities: Not At Risk (02/24/2023)  Financial Resource Strain: Medium Risk (02/25/2023)  Social Connections: Unknown (01/28/2022)   Received from [facility]  Tobacco Use: Low Risk  (05/12/2023)   SDOH Interventions:     Readmission Risk Interventions  No data to display

## 2023-05-13 NOTE — ED Notes (Signed)
Patient able to stand and use a urinal. No shortness of breath when standing.

## 2023-05-13 NOTE — H&P (Signed)
History and Physical    Patient: Alex Scott NWG:[id number] DOB: [birth year] DOA: 05/12/2023 DOS: the patient was seen and examined on 05/13/2023 PCP: Pcp, No  Patient coming from: Home  Chief Complaint:  Chief Complaint  Patient presents with   Chest Pain   HPI: Alex Scott is a 27 y.o. male with medical history significant of THC use, vaping who presented to med [facility] ED on 8/28 for sudden onset sharp right-sided chest pain and shortness of breath.  Patient reports onset hour prior to ED arrival with associated increased pain on deep inspiration.    Recently admitted June 2024 for spontaneous pneumothorax, did not require chest tube or surgical procedure and following discharge follow-up x-ray at urgent care showed resolution.  In the ED, temperature 98.6 F, HR 80, RR 20, BP 155/87, SpO2 98% on room air.  WBC 9.8, hemoglobin 14.8, platelet count 209.  Sodium 133, potassium 3.4, chloride 99, CO2 23, glucose 101, BUN 13, creatinine 0.98.  Chest x-ray with increase in right-sided pneumothorax when compared with the prior exam measuring approximately 2.4 cm.  EDP discussed with PCCM who recommended admission to the hospitalist service and no indication for chest tube at this time.  Patient was transferred to [facility] under the hospitalist service for further evaluation and management of recurrent spontaneous pneumothorax.   Review of Systems: As mentioned in the history of present illness. All other systems reviewed and are negative. History reviewed. No pertinent past medical history. Past Surgical History:  Procedure Laterality Date   APPENDECTOMY     Social History:  reports that he has never smoked. He has never used smokeless tobacco. He reports current alcohol use. He reports current drug use. Drug: Marijuana.  No Known Allergies  History reviewed. No pertinent family history.  Prior to Admission medications   Medication Sig Start Date End Date Taking? Authorizing  Provider  acetaminophen (TYLENOL) 500 MG tablet Take 500 mg by mouth 2 (two) times daily as needed for moderate pain.   Yes [provider]    Physical Exam: Vitals:   05/13/23 0000 05/13/23 0151 05/13/23 0500 05/13/23 0749  BP: 128/85 132/68  133/76  Pulse: 78 61  62  Resp: 20   16  Temp:  97.8 F (36.6 C)  98.5 F (36.9 C)  TempSrc:  Axillary  Oral  SpO2: 100% 100%  100%  Weight:   91 kg   Height:       GEN: 27 yo male in NAD, alert and oriented x 3, wd/wn HEENT: NCAT, PERRL, EOMI, sclera clear, MMM PULM: CTAB w/o wheezes/crackles, normal respiratory effort, on facemask O2 CV: RRR w/o M/G/R GI: abd soft, NTND, NABS, no R/G/M MSK: no peripheral edema, muscle strength globally intact 5/5 bilateral upper/lower extremities NEURO: CN II-XII intact, no focal deficits, sensation to light touch intact PSYCH: normal mood/affect Integumentary: dry/intact, no rashes or wounds   Assessment and Plan:   Right pneumothorax, recurrent Patient presenting to the ED with acute onset right sided chest pain associated with shortness of breath, increased pain on deep inspiration.  Complicated by his history of vaping, THC use and heavy lifting at work.  Patient with previous pneumothorax June 2024, treated conservatively with oxygen with repeat chest x-ray outpatient at urgent care showing resolution. -- PCCM following, appreciate assistance -- Consult cardiothoracic surgery -- Continue supplemental oxygen, maintain SpO2 greater than 94%  THC use disorder Hx vaping Discussed need for complete cessation/abstinence given his recurrent episodes of pneumothorax.  Advance Care Planning:   Code Status: Full Code   Consults:  Pulmonary critical care medicine Cardiothoracic surgery  Family Communication: Updated mother present at bedside  Severity of Illness: The appropriate patient status for this patient is INPATIENT. Inpatient status is judged to be reasonable and necessary in  order to provide the required intensity of service to ensure the patient's safety. The patient's presenting symptoms, physical exam findings, and initial radiographic and laboratory data in the context of their chronic comorbidities is felt to place them at high risk for further clinical deterioration. Furthermore, it is not anticipated that the patient will be medically stable for discharge from the hospital within 2 midnights of admission.   * I certify that at the point of admission it is my clinical judgment that the patient will require inpatient hospital care spanning beyond 2 midnights from the point of admission due to high intensity of service, high risk for further deterioration and high frequency of surveillance required.*  Author: Alvira Philips [place], DO 05/13/2023 8:14 AM  For on call review [email or web address].

## 2023-05-13 NOTE — ED Provider Notes (Signed)
[place] EMERGENCY DEPARTMENT AT [facility]  Provider Note  CSN: [id number] Arrival date & time: 05/12/23 2251  History Chief Complaint  Patient presents with   Chest Pain    Alex Scott is a 27 y.o. male with no significant PMH was admitted in early June 2024 for a spontaneous pneumothorax. He spent 2 days in the hospital on oxygen for nitrogen washout but did not require chest tube or any other surgical procedures. He had a follow up xray at local UC a week or so later which showed resolution. About an hour prior to arrival he had sudden onset of sharp R sided chest pain and SOB, worse with deep breath similar to previous. He is feeling some better now.    Home Medications Prior to Admission medications   Medication Sig Start Date End Date Taking? Authorizing Provider  acetaminophen (TYLENOL) 500 MG tablet Take 500 mg by mouth 2 (two) times daily as needed for moderate pain.    [provider]     Allergies    Patient has no known allergies.   Review of Systems   Review of Systems Please see HPI for pertinent positives and negatives  Physical Exam BP 128/85   Pulse 78   Temp 98.6 F (37 C)   Resp 20   Ht 5\' 10"  (1.778 m)   Wt 88.5 kg   SpO2 100%   BMI 27.98 kg/m   Physical Exam Vitals and nursing note reviewed.  Constitutional:      Appearance: Normal appearance.  HENT:     Head: Normocephalic and atraumatic.     Nose: Nose normal.     Mouth/Throat:     Mouth: Mucous membranes are moist.  Eyes:     Extraocular Movements: Extraocular movements intact.     Conjunctiva/sclera: Conjunctivae normal.  Cardiovascular:     Rate and Rhythm: Normal rate.  Pulmonary:     Effort: Pulmonary effort is normal.     Comments: Breath sounds diminished on the right Abdominal:     General: Abdomen is flat.     Palpations: Abdomen is soft.     Tenderness: There is no abdominal tenderness.  Musculoskeletal:        General: No swelling. Normal range  of motion.     Cervical back: Neck supple.  Skin:    General: Skin is warm and dry.  Neurological:     General: No focal deficit present.     Mental Status: He is alert.  Psychiatric:        Mood and Affect: Mood normal.     ED Results / Procedures / Treatments   EKG EKG Interpretation Date/Time:  Tuesday May 12 2023 22:58:28 EDT Ventricular Rate:  92 PR Interval:  160 QRS Duration:  87 QT Interval:  328 QTC Calculation: 406 R Axis:   56  Text Interpretation: Sinus rhythm ST elev, probable normal early repol pattern Since last tracing rate faster Confirmed by Susy Frizzle [phone]) on 05/12/2023 11:55:10 PM  Procedures Procedures  Medications Ordered in the ED Medications - No data to display  Initial Impression and Plan  Patient with recent spontaneous PTX here with symptoms similar to previous. I personally viewed the images from radiology studies and agree with radiologist interpretation: CXR with return of small PTX. Patient is hemodynamically stable, no hypoxia or distress. Will check labs and discuss with [facility]  Outpatient Surgery  ED Course   Clinical Course as of 05/13/23 0029  Wed May 13, 2023  90 Spoke with Dr. Vassie Loll, Pulm who recommends admission to Hospitalist. No indication for chest tube tonight. They will consult in AM.  [CS]  0027 Spoke with Dr. Antionette Char, Hospitalist, who will admit.   CBC and BMP unremarkable.  [CS]    Clinical Course User Index [CS] Pollyann Savoy, MD     MDM Rules/Calculators/A&P Medical Decision Making Problems Addressed: Recurrent spontaneous pneumothorax: acute illness or injury  Amount and/or Complexity of Data Reviewed Labs: ordered. Decision-making details documented in ED Course. Radiology: ordered and independent interpretation performed. Decision-making details documented in ED Course. ECG/medicine tests: ordered and independent interpretation performed. Decision-making details documented in ED Course.  Risk Decision regarding  hospitalization.     Final Clinical Impression(s) / ED Diagnoses Final diagnoses:  Recurrent spontaneous pneumothorax    Rx / DC Orders ED Discharge Orders     None        Pollyann Savoy, MD 05/13/23 [phone]

## 2023-05-13 NOTE — Consult Note (Addendum)
[address]       [place] [address]             [phone]        Markal Cedillos [facility] Medical Record #[id number] Date of Birth: 01-20-1996  Referring: Craige Cotta  Chief Complaint: Recurrent Spontaneous Pneumothorax    Chief Complaint  Patient presents with   Chest Pain   History of Present Illness:      Alex Scott is a 27 yo male with history of spontaneous pneumothorax.  He has a history of marijuana abuse presenting back in June 2024 with spontaneous pneumothorax.  This was treated medically at that time.  He presented to the ED on 05/12/2023 with complaints of sudden onset shortness of breath.  The patient had smoked marijuana early in the day and went to work.  He was performing heavy lifting throughout the day.  He went to get in his car when symptoms began and this was associated with hot and flushed.  He presented to the ED and was found to have a recurrent pneumothorax.  He was started on oxygen.  He used to vape but quit after his previous pneumothorax.  He does smoke marijuana daily.  He is otherwise healthy.   History reviewed. No pertinent past medical history.  Past Surgical History:  Procedure Laterality Date   APPENDECTOMY      Social History   Tobacco Use  Smoking Status Never  Smokeless Tobacco Never    Social History   Substance and Sexual Activity  Alcohol Use Yes   Comment: occ   No Known Allergies  Current Facility-Administered Medications  Medication Dose Route Frequency Provider Last Rate Last Admin   acetaminophen (TYLENOL) tablet 650 mg  650 mg Oral Q6H PRN Howerter, Justin B, DO       Or   acetaminophen (TYLENOL) suppository 650 mg  650 mg Rectal Q6H PRN Howerter, Justin B, DO       fentaNYL (SUBLIMAZE) injection 25 mcg  25 mcg Intravenous Q2H PRN Howerter, Justin B, DO       melatonin tablet 3 mg  3 mg Oral QHS PRN Howerter, Justin B, DO       naloxone (NARCAN) injection 0.4 mg  0.4 mg Intravenous PRN Howerter, Justin  B, DO       ondansetron (ZOFRAN) injection 4 mg  4 mg Intravenous Q6H PRN Howerter, Justin B, DO       oxyCODONE (Oxy IR/ROXICODONE) immediate release tablet 5 mg  5 mg Oral Q6H PRN [place], Eric J, DO        Medications Prior to Admission  Medication Sig Dispense Refill Last Dose   acetaminophen (TYLENOL) 500 MG tablet Take 500 mg by mouth 2 (two) times daily as needed for moderate pain.   Unknown    History reviewed. No pertinent family history.   Review of Systems:   ROS    Cardiac Review of Systems: Y or  [    ]= no  Chest Pain [  N  ]  Resting SOB [ N  ] Exertional SOB  [  ]  Orthopnea [  ]   Pedal Edema [   ]    Palpitations [ N ] Syncope  [ N ]   Presyncope [   ]  General Review of Systems: [Y] = yes [  ]=no Constitional: recent weight change [  ]; anorexia [  ]; fatigue [  ]; nausea [  ];  night sweats [  ]; fever Klaus.Mock  ]; or chills [  ]                                                               Dental: Last Dentist visit:   Eye : blurred vision [  ]; diplopia [   ]; vision changes [  ];  Amaurosis fugax[  ]; Resp: cough [  ];  wheezing[  ];  hemoptysis[ N ]; shortness of breath[  ]; paroxysmal nocturnal dyspnea[  ]; dyspnea on exertion[  ]; or orthopnea[  ];  GI:  gallstones[  ], vomiting[N  ];  dysphagia[  ]; melena[  ];  hematochezia [  ]; heartburn[  ];   Hx of  Colonoscopy[  ]; GU: kidney stones [  ]; hematuria[  ];   dysuria [  ];  nocturia[  ];  history of     obstruction [  ]; urinary frequency [  ]             Skin: rash, swelling[ N ];, hair loss[  ];  peripheral edema[  ];  or itching[  ]; Musculosketetal: myalgias[  ];  joint swelling[  ];  joint erythema[  ];  joint pain[  ];  back pain[  ];  Heme/Lymph: bruising[  ];  bleeding[  ];  anemia[  ];  Neuro: TIA[  ];  headaches[ N ];  stroke[  ];  vertigo[  ];  seizures[  ];   paresthesias[  ];  difficulty walking[ N ];  Psych:depression[  ]; anxiety[  ];  Endocrine: diabetes[ N ];  thyroid dysfunction[ N  ];  Physical Exam: BP 133/76 (BP Location: Left Arm)   Pulse 62   Temp 98.5 F (36.9 C) (Oral)   Resp 16   Ht 5\' 10"  (1.778 m)   Wt 91 kg   SpO2 100%   BMI 28.79 kg/m   General appearance: alert, cooperative, and no distress Head: Normocephalic, without obvious abnormality, atraumatic Resp: clear to auscultation bilaterally Cardio: regular rate and rhythm GI: soft, non-tender; bowel sounds normal; no masses,  no organomegaly Extremities: extremities normal, atraumatic, no cyanosis or edema Neurologic: Grossly normal  Diagnostic Studies & Laboratory data:     Recent Radiology Findings:   DG CHEST PORT 1 VIEW  Result Date: 05/13/2023 CLINICAL DATA:  Pneumothorax EXAM: PORTABLE CHEST 1 VIEW COMPARISON:  Radiograph 05/12/2023 FINDINGS: Moderate volume RIGHT pneumothorax again demonstrated. Slight increase in volume with the pleural edge 15 mm from the lateral chest wall compared to 7 mm on prior. The apical pleural edge measures 13 mm from chest wall compared to 16 mm. No mediastinal shift.  No rib fracture. IMPRESSION: Slight increase in volume of moderate RIGHT pneumothorax compared to 1 day prior. Electronically Signed   By: Genevive Bi M.D.   On: 05/13/2023 08:42   DG Chest 2 View  Result Date: 05/12/2023 CLINICAL DATA:  Shortness of breath and chest pain, history of spontaneous pneumothorax 1 month ago EXAM: CHEST - 2 VIEW COMPARISON:  02/26/2023 FINDINGS: Cardiac shadow is within normal limits. Left lung is clear. Right lung is well aerated although and apical pneumothorax is seen with approximately 2.4 cm of apical excursion increased when compared with the prior exam. No rib abnormality  is noted. No other focal abnormality is seen. IMPRESSION: Increase in right-sided pneumothorax when compared with the prior exam. Critical Value/emergent results were called by telephone at the time of interpretation on 05/12/2023 at 11:17 pm to Dr. Rubin Payor , who verbally acknowledged these  results. Electronically Signed   By: Alcide Clever M.D.   On: 05/12/2023 23:18     I have independently reviewed the above radiologic studies and discussed with the patient   Recent Lab Findings: Lab Results  Component Value Date   WBC 9.8 05/13/2023   HGB 14.8 05/13/2023   HCT 40.2 05/13/2023   PLT 209 05/13/2023   GLUCOSE 101 (H) 05/13/2023   NA 133 (L) 05/13/2023   K 3.4 (L) 05/13/2023   CL 99 05/13/2023   CREATININE 0.98 05/13/2023   BUN 13 05/13/2023   CO2 23 05/13/2023    Assessment / Plan:     Spontaneous Pneumothorax-  CXR shows right moderate pneumothorax.  No chest tube in place.  He initially presented in June treated medically, now presents with recurrence.. we have been consulted for VATS procedure... CT scan obtained in June did not show evidence of bullous disease Marijuana abuse- needs counseling on cessation    Dispo- per medicine.. will discuss patient with Dr. Dorris Fetch for possible Robotic Assisted VATS procedure with mechanical pleurodesis, pleurectomy.Marland Kitchen He will follow up with further recommendations  I  spent 60 minutes counseling the patient face to face.  Lowella Dandy, PA-C 05/13/2023 11:34 AM  27 yo man with history of vaping and smoking marijuana.  Had a spontaneous pneumothorax in June managed without chest tube.  Now presents with recurrent right pneumothorax.  Again limited with no need for chest tube. I reviewed the records, CXR and CT images and examined Alex Scott.  Discussed the issue with him.  After a 2nd pneumothorax there is at least a 50% chance of additional occurrences, and in my own experience it is significantly higher than that.  Surgery is indicated after a recurrent pneumothorax.  I recommended we proceed with a Right robotic assisted bleb resection and pleural stripping/ abrasion for management of his recurrent pneumothorax.  I discussed the general nature of the procedure, including the need for general anesthesia, the incisions to be  used, the use of the surgical robot and the use of a drainage tube postoperatively with Alex Scott.  We discussed the expected hospital stay, overall recovery and short and long term outcomes. I informed him of the indications, risks, benefits and alternatives.  He understands the risks include, but are not limited to death DVT/PE, bleeding, possible need for transfusion, infections, prolonged air leaks as well s other organ system dysfunction including respiratory, renal, or GI complications.  He is low risk as a young healthy male.    He accepts the risks and agrees to proceed.   Plan OR in AM  [place] C. Dorris Fetch, MD [facility] [phone]

## 2023-05-13 NOTE — ED Notes (Signed)
Placed patient on 100% NRB due to a RT Apical pneumothorax. Patient oxygen saturation of 98%. Will continiue to monitor and make changes as needed. Patient tolerating well.

## 2023-05-13 NOTE — Plan of Care (Signed)

## 2023-05-13 NOTE — ED Notes (Signed)
Repaged for Hospitalist @ 00:52

## 2023-05-13 NOTE — Progress Notes (Signed)
Pt arrived to 6N from [facility] on non rebreather mask. He is AAOX4 and his vital signs are stable. [facility] Admits paged to notify of patients arrival. Patient denies any shortness of breath or pain at this time.

## 2023-05-13 NOTE — Plan of Care (Signed)
  Problem: Clinical Measurements: Goal: Respiratory complications will improve Outcome: Progressing   Problem: Pain Managment: Goal: General experience of comfort will improve Outcome: Progressing   Problem: Education: Goal: Knowledge of General Education information will improve Description: Including pain rating scale, medication(s)/side effects and non-pharmacologic comfort measures Outcome: Progressing

## 2023-05-13 NOTE — Consult Note (Signed)
[name], MRN:  [id number], DOB:  12/05/1995, LOS: 0 ADMISSION DATE:  05/12/2023, CONSULTATION DATE: 8/28 REFERRING MD: Antionette Char, CHIEF COMPLAINT: Recurrent spontaneous right pneumothorax  History of Present Illness:  27 yo male who vaps and smokes marijuana was in hospital for spontaneous right pneumothorax in June 2024.  This was treated conservatively.  Chest xray report from 03/06/23 shows resolution of pneumothorax.  He felt fine until 05/12/23.  He had smoked marijuana earlier in the day.  He has to do heavy lifting at work.  He was getting into his car when he noticed suddenly feeling short of breath again.  He also felt hot and flushed.  He came to ER.  Chest xray showed recurrent right pneumothorax.  He was started on supplemental oxygen.  PCCM consulted to assist with management.  Pertinent  Medical History  Spontaneous pneumothorax  Significant Hospital Events: Including procedures, antibiotic start and stop dates in addition to other pertinent events   8/27 Admit 8/28 Consult thoracic surgery  Studies:  CT chest with contrast 03/22/2023 >> small Rt PTX, lungs clear  Interim History / Subjective:  Breathing better this morning.  Denies cough, chest pain.  No recent exposures.  No symptoms/signs of respiratory infection.  No history of asthma.  No family history of lung disease that he is aware of.  Objective   Blood pressure 132/68, pulse 61, temperature 97.8 F (36.6 C), temperature source Axillary, resp. rate 20, height 5\' 10"  (1.778 m), weight 88.5 kg, SpO2 100%.        Intake/Output Summary (Last 24 hours) at 05/13/2023 0423 Last data filed at 05/13/2023 0056 Gross per 24 hour  Intake --  Output 250 ml  Net -250 ml   Filed Weights   05/12/23 2257  Weight: 88.5 kg    Examination:  General - alert Eyes - pupils reactive ENT - no sinus tenderness, no stridor Cardiac - regular rate/rhythm, no murmur Chest - equal breath sounds b/l, no wheezing or rales, no  crepitus Abdomen - soft, non tender, + bowel sounds Extremities - no cyanosis, clubbing, or edema Skin - no rashes Neuro - normal strength, moves extremities, follows commands Psych - normal mood and behavior  Discussion:  He has recurrent right spontaneous pneumothorax.  This is likely related to vaping, smoking marijuana, and heavy lifting related to his work.  CXR from 8/28 shows persistent pneumothorax.  He is clinically stable at this time.  Given that he has second occurrence of pneumothorax, would be best to have thoracic surgery assess prior to performing any procedures regarding his pneumothorax.  Assessment & Plan:   Recurrent Rt pneumothorax. - primary team to consult thoracic surgery - continue supplemental oxygen to keep SpO2 > 94% - f/u CXR - discussed the importance of avoiding airway irritants, smoking - he will need to limit activity for the next several weeks >> no bending, squatting, stooping, heavy lifting, sexual activity  Updated his mother at bedside.  Labs   CBC: Recent Labs  Lab 05/13/23 0006  WBC 9.8  NEUTROABS 7.2  HGB 14.8  HCT 40.2  MCV 80.2  PLT 209    Basic Metabolic Panel: Recent Labs  Lab 05/13/23 0006  NA 133*  K 3.4*  CL 99  CO2 23  GLUCOSE 101*  BUN 13  CREATININE 0.98  CALCIUM 9.2   GFR: Estimated Creatinine Clearance: 128 mL/min (by C-G formula based on SCr of 0.98 mg/dL). Recent Labs  Lab 05/13/23 0006  WBC 9.8  Liver Function Tests: No results for input(s): "AST", "ALT", "ALKPHOS", "BILITOT", "PROT", "ALBUMIN" in the last 168 hours. No results for input(s): "LIPASE", "AMYLASE" in the last 168 hours. No results for input(s): "AMMONIA" in the last 168 hours.  ABG No results found for: "PHART", "PCO2ART", "PO2ART", "HCO3", "TCO2", "ACIDBASEDEF", "O2SAT"   Coagulation Profile: No results for input(s): "INR", "PROTIME" in the last 168 hours.  Cardiac Enzymes: No results for input(s): "CKTOTAL", "CKMB",  "CKMBINDEX", "TROPONINI" in the last 168 hours.  HbA1C: No results found for: "HGBA1C"  CBG: No results for input(s): "GLUCAP" in the last 168 hours.  Review of Systems:   Reviewed and negative  Surgical History:   Past Surgical History:  Procedure Laterality Date   APPENDECTOMY       Social History:   reports that he has never smoked. He has never used smokeless tobacco. He reports current alcohol use. He reports current drug use. Drug: Marijuana.   Family History:  His family history is not on file.   Allergies No Known Allergies   Home Medications  Prior to Admission medications   Medication Sig Start Date End Date Taking? Authorizing Provider  acetaminophen (TYLENOL) 500 MG tablet Take 500 mg by mouth 2 (two) times daily as needed for moderate pain.    [provider]     Signature:  Coralyn Helling, MD [facility] Pulmonary/Critical Care Pager - [phone] or [phone] 05/13/2023, 8:44 AM

## 2023-05-13 NOTE — Progress Notes (Signed)
(  Carryover admission to the Day Admitter; accepted by Dr.  Antionette Char as transfer from  [facility]  to a  med-tele bed at  [facility]  for spontaneous right apical pneumothorax; EDP at [facility] d/w on-call PCCM, who did not feel that chest tube was warranted at this time. Rather PCCM recommended [facility] admit to [facility] and conveyed that PCCM will consult in the AM . Please see Dr.  Francesco Runner transfer progress note for additional details).  I have placed some additional preliminary admit orders via the adult multi-morbid admission order set. I have also ordered prn IV fentanyl for pain, in addition to the morning labs to include CMP, CBC, and magnesium level.    Newton Pigg, DO Hospitalist

## 2023-05-13 NOTE — Anesthesia Preprocedure Evaluation (Signed)
Anesthesia Evaluation  Patient identified by MRN, date of birth, ID band Patient awake    Reviewed: Allergy & Precautions, H&P , NPO status , Patient's Chart, lab work & pertinent test results  Airway Mallampati: II  TM Distance: >3 FB Neck ROM: Full    Dental no notable dental hx. (+) Teeth Intact, Dental Advisory Given   Pulmonary neg pulmonary ROS, Patient abstained from smoking.   Pulmonary exam normal breath sounds clear to auscultation       Cardiovascular Exercise Tolerance: Good negative cardio ROS  Rhythm:Regular Rate:Normal     Neuro/Psych negative neurological ROS  negative psych ROS   GI/Hepatic negative GI ROS, Neg liver ROS,,,  Endo/Other  negative endocrine ROS    Renal/GU negative Renal ROS  negative genitourinary   Musculoskeletal   Abdominal   Peds  Hematology negative hematology ROS (+)   Anesthesia Other Findings   Reproductive/Obstetrics negative OB ROS                             Anesthesia Physical Anesthesia Plan  ASA: 2  Anesthesia Plan: General   Post-op Pain Management: Ofirmev IV (intra-op)* and Toradol IV (intra-op)*   Induction: Intravenous  PONV Risk Score and Plan: 3 and Ondansetron, Dexamethasone and Midazolam  Airway Management Planned: Double Lumen EBT  Additional Equipment: ClearSight  Intra-op Plan:   Post-operative Plan: Extubation in OR  Informed Consent: I have reviewed the patients History and Physical, chart, labs and discussed the procedure including the risks, benefits and alternatives for the proposed anesthesia with the patient or authorized representative who has indicated his/her understanding and acceptance.     Dental advisory given  Plan Discussed with: CRNA  Anesthesia Plan Comments:        Anesthesia Quick Evaluation

## 2023-05-13 NOTE — ED Notes (Signed)
[facility] reached out for transport @ 00:39  No ETA.Marland KitchenMarland KitchenMarland Kitchen

## 2023-05-13 NOTE — H&P (View-Only) (Signed)
[address]       [place] [address]             [phone]        Markal Cedillos [facility] Medical Record #[id number] Date of Birth: 01-20-1996  Referring: Craige Cotta  Chief Complaint: Recurrent Spontaneous Pneumothorax    Chief Complaint  Patient presents with   Chest Pain   History of Present Illness:      Alex Scott is a 27 yo male with history of spontaneous pneumothorax.  He has a history of marijuana abuse presenting back in June 2024 with spontaneous pneumothorax.  This was treated medically at that time.  He presented to the ED on 05/12/2023 with complaints of sudden onset shortness of breath.  The patient had smoked marijuana early in the day and went to work.  He was performing heavy lifting throughout the day.  He went to get in his car when symptoms began and this was associated with hot and flushed.  He presented to the ED and was found to have a recurrent pneumothorax.  He was started on oxygen.  He used to vape but quit after his previous pneumothorax.  He does smoke marijuana daily.  He is otherwise healthy.   History reviewed. No pertinent past medical history.  Past Surgical History:  Procedure Laterality Date   APPENDECTOMY      Social History   Tobacco Use  Smoking Status Never  Smokeless Tobacco Never    Social History   Substance and Sexual Activity  Alcohol Use Yes   Comment: occ   No Known Allergies  Current Facility-Administered Medications  Medication Dose Route Frequency Provider Last Rate Last Admin   acetaminophen (TYLENOL) tablet 650 mg  650 mg Oral Q6H PRN Howerter, Justin B, DO       Or   acetaminophen (TYLENOL) suppository 650 mg  650 mg Rectal Q6H PRN Howerter, Justin B, DO       fentaNYL (SUBLIMAZE) injection 25 mcg  25 mcg Intravenous Q2H PRN Howerter, Justin B, DO       melatonin tablet 3 mg  3 mg Oral QHS PRN Howerter, Justin B, DO       naloxone (NARCAN) injection 0.4 mg  0.4 mg Intravenous PRN Howerter, Justin  B, DO       ondansetron (ZOFRAN) injection 4 mg  4 mg Intravenous Q6H PRN Howerter, Justin B, DO       oxyCODONE (Oxy IR/ROXICODONE) immediate release tablet 5 mg  5 mg Oral Q6H PRN [place], Eric J, DO        Medications Prior to Admission  Medication Sig Dispense Refill Last Dose   acetaminophen (TYLENOL) 500 MG tablet Take 500 mg by mouth 2 (two) times daily as needed for moderate pain.   Unknown    History reviewed. No pertinent family history.   Review of Systems:   ROS    Cardiac Review of Systems: Y or  [    ]= no  Chest Pain [  N  ]  Resting SOB [ N  ] Exertional SOB  [  ]  Orthopnea [  ]   Pedal Edema [   ]    Palpitations [ N ] Syncope  [ N ]   Presyncope [   ]  General Review of Systems: [Y] = yes [  ]=no Constitional: recent weight change [  ]; anorexia [  ]; fatigue [  ]; nausea [  ];  night sweats [  ]; fever Klaus.Mock  ]; or chills [  ]                                                               Dental: Last Dentist visit:   Eye : blurred vision [  ]; diplopia [   ]; vision changes [  ];  Amaurosis fugax[  ]; Resp: cough [  ];  wheezing[  ];  hemoptysis[ N ]; shortness of breath[  ]; paroxysmal nocturnal dyspnea[  ]; dyspnea on exertion[  ]; or orthopnea[  ];  GI:  gallstones[  ], vomiting[N  ];  dysphagia[  ]; melena[  ];  hematochezia [  ]; heartburn[  ];   Hx of  Colonoscopy[  ]; GU: kidney stones [  ]; hematuria[  ];   dysuria [  ];  nocturia[  ];  history of     obstruction [  ]; urinary frequency [  ]             Skin: rash, swelling[ N ];, hair loss[  ];  peripheral edema[  ];  or itching[  ]; Musculosketetal: myalgias[  ];  joint swelling[  ];  joint erythema[  ];  joint pain[  ];  back pain[  ];  Heme/Lymph: bruising[  ];  bleeding[  ];  anemia[  ];  Neuro: TIA[  ];  headaches[ N ];  stroke[  ];  vertigo[  ];  seizures[  ];   paresthesias[  ];  difficulty walking[ N ];  Psych:depression[  ]; anxiety[  ];  Endocrine: diabetes[ N ];  thyroid dysfunction[ N  ];  Physical Exam: BP 133/76 (BP Location: Left Arm)   Pulse 62   Temp 98.5 F (36.9 C) (Oral)   Resp 16   Ht 5\' 10"  (1.778 m)   Wt 91 kg   SpO2 100%   BMI 28.79 kg/m   General appearance: alert, cooperative, and no distress Head: Normocephalic, without obvious abnormality, atraumatic Resp: clear to auscultation bilaterally Cardio: regular rate and rhythm GI: soft, non-tender; bowel sounds normal; no masses,  no organomegaly Extremities: extremities normal, atraumatic, no cyanosis or edema Neurologic: Grossly normal  Diagnostic Studies & Laboratory data:     Recent Radiology Findings:   DG CHEST PORT 1 VIEW  Result Date: 05/13/2023 CLINICAL DATA:  Pneumothorax EXAM: PORTABLE CHEST 1 VIEW COMPARISON:  Radiograph 05/12/2023 FINDINGS: Moderate volume RIGHT pneumothorax again demonstrated. Slight increase in volume with the pleural edge 15 mm from the lateral chest wall compared to 7 mm on prior. The apical pleural edge measures 13 mm from chest wall compared to 16 mm. No mediastinal shift.  No rib fracture. IMPRESSION: Slight increase in volume of moderate RIGHT pneumothorax compared to 1 day prior. Electronically Signed   By: Genevive Bi M.D.   On: 05/13/2023 08:42   DG Chest 2 View  Result Date: 05/12/2023 CLINICAL DATA:  Shortness of breath and chest pain, history of spontaneous pneumothorax 1 month ago EXAM: CHEST - 2 VIEW COMPARISON:  02/26/2023 FINDINGS: Cardiac shadow is within normal limits. Left lung is clear. Right lung is well aerated although and apical pneumothorax is seen with approximately 2.4 cm of apical excursion increased when compared with the prior exam. No rib abnormality  is noted. No other focal abnormality is seen. IMPRESSION: Increase in right-sided pneumothorax when compared with the prior exam. Critical Value/emergent results were called by telephone at the time of interpretation on 05/12/2023 at 11:17 pm to Dr. Rubin Payor , who verbally acknowledged these  results. Electronically Signed   By: Alcide Clever M.D.   On: 05/12/2023 23:18     I have independently reviewed the above radiologic studies and discussed with the patient   Recent Lab Findings: Lab Results  Component Value Date   WBC 9.8 05/13/2023   HGB 14.8 05/13/2023   HCT 40.2 05/13/2023   PLT 209 05/13/2023   GLUCOSE 101 (H) 05/13/2023   NA 133 (L) 05/13/2023   K 3.4 (L) 05/13/2023   CL 99 05/13/2023   CREATININE 0.98 05/13/2023   BUN 13 05/13/2023   CO2 23 05/13/2023    Assessment / Plan:     Spontaneous Pneumothorax-  CXR shows right moderate pneumothorax.  No chest tube in place.  He initially presented in June treated medically, now presents with recurrence.. we have been consulted for VATS procedure... CT scan obtained in June did not show evidence of bullous disease Marijuana abuse- needs counseling on cessation    Dispo- per medicine.. will discuss patient with Dr. Dorris Fetch for possible Robotic Assisted VATS procedure with mechanical pleurodesis, pleurectomy.Marland Kitchen He will follow up with further recommendations  I  spent 60 minutes counseling the patient face to face.  Lowella Dandy, PA-C 05/13/2023 11:34 AM  27 yo man with history of vaping and smoking marijuana.  Had a spontaneous pneumothorax in June managed without chest tube.  Now presents with recurrent right pneumothorax.  Again limited with no need for chest tube. I reviewed the records, CXR and CT images and examined Alex Scott.  Discussed the issue with him.  After a 2nd pneumothorax there is at least a 50% chance of additional occurrences, and in my own experience it is significantly higher than that.  Surgery is indicated after a recurrent pneumothorax.  I recommended we proceed with a Right robotic assisted bleb resection and pleural stripping/ abrasion for management of his recurrent pneumothorax.  I discussed the general nature of the procedure, including the need for general anesthesia, the incisions to be  used, the use of the surgical robot and the use of a drainage tube postoperatively with Alex Scott.  We discussed the expected hospital stay, overall recovery and short and long term outcomes. I informed him of the indications, risks, benefits and alternatives.  He understands the risks include, but are not limited to death DVT/PE, bleeding, possible need for transfusion, infections, prolonged air leaks as well s other organ system dysfunction including respiratory, renal, or GI complications.  He is low risk as a young healthy male.    He accepts the risks and agrees to proceed.   Plan OR in AM  [place] C. Dorris Fetch, MD [facility] [phone]

## 2023-05-14 ENCOUNTER — Encounter (HOSPITAL_COMMUNITY): Payer: Self-pay | Admitting: Family Medicine

## 2023-05-14 ENCOUNTER — Inpatient Hospital Stay (HOSPITAL_COMMUNITY): Payer: Self-pay | Admitting: Anesthesiology

## 2023-05-14 ENCOUNTER — Encounter (HOSPITAL_COMMUNITY)
Admission: EM | Disposition: A | Discharge status: Home / Self Care | Payer: Self-pay | Source: Home / Self Care | Attending: Thoracic Surgery (Cardiothoracic Vascular Surgery)

## 2023-05-14 ENCOUNTER — Other Ambulatory Visit: Payer: Self-pay

## 2023-05-14 ENCOUNTER — Inpatient Hospital Stay (HOSPITAL_COMMUNITY): Payer: Self-pay

## 2023-05-14 DIAGNOSIS — Z9889 Other specified postprocedural states: Secondary | ICD-10-CM

## 2023-05-14 DIAGNOSIS — J9312 Secondary spontaneous pneumothorax: Secondary | ICD-10-CM

## 2023-05-14 HISTORY — PX: STAPLING OF BLEBS: SHX6429

## 2023-05-14 HISTORY — PX: INTERCOSTAL NERVE BLOCK: SHX5021

## 2023-05-14 HISTORY — PX: PLEURADESIS: SHX6030

## 2023-05-14 LAB — COMPREHENSIVE METABOLIC PANEL
ALT: 34 U/L (ref 0–44)
AST: 26 U/L (ref 15–41)
Albumin: 4 g/dL (ref 3.5–5.0)
Alkaline Phosphatase: 44 U/L (ref 38–126)
Anion gap: 9 (ref 5–15)
BUN: 14 mg/dL (ref 6–20)
CO2: 24 mmol/L (ref 22–32)
Calcium: 9.1 mg/dL (ref 8.9–10.3)
Chloride: 103 mmol/L (ref 98–111)
Creatinine, Ser: 1.09 mg/dL (ref 0.61–1.24)
GFR, Estimated: >60 mL/min (ref >60)
Glucose, Bld: 101 mg/dL — ABNORMAL HIGH (ref 70–99)
Potassium: 4 mmol/L (ref 3.5–5.1)
Sodium: 136 mmol/L (ref 135–145)
Total Bilirubin: 1.2 mg/dL (ref 0.3–1.2)
Total Protein: 7.3 g/dL (ref 6.5–8.1)

## 2023-05-14 LAB — CBC WITH DIFFERENTIAL/PLATELET
Abs Immature Granulocytes: 0.04 10*3/uL (ref 0.00–0.07)
Basophils Absolute: 0 10*3/uL (ref 0.0–0.1)
Basophils Relative: 0 %
Eosinophils Absolute: 0.1 10*3/uL (ref 0.0–0.5)
Eosinophils Relative: 1 %
HCT: 42.7 % (ref 39.0–52.0)
Hemoglobin: 15.4 g/dL (ref 13.0–17.0)
Immature Granulocytes: 1 %
Lymphocytes Relative: 52 %
Lymphs Abs: 3.7 10*3/uL (ref 0.7–4.0)
MCH: 29.1 pg (ref 26.0–34.0)
MCHC: 36.1 g/dL — ABNORMAL HIGH (ref 30.0–36.0)
MCV: 80.6 fL (ref 80.0–100.0)
Monocytes Absolute: 0.6 10*3/uL (ref 0.1–1.0)
Monocytes Relative: 9 %
Neutro Abs: 2.6 10*3/uL (ref 1.7–7.7)
Neutrophils Relative %: 37 %
Platelets: 213 10*3/uL (ref 150–400)
RBC: 5.3 MIL/uL (ref 4.22–5.81)
RDW: 12.8 % (ref 11.5–15.5)
WBC: 7 10*3/uL (ref 4.0–10.5)
nRBC: 0 % (ref 0.0–0.2)

## 2023-05-14 LAB — SURGICAL PCR SCREEN
MRSA, PCR: NEGATIVE
Staphylococcus aureus: NEGATIVE

## 2023-05-14 LAB — MAGNESIUM: Magnesium: 2 mg/dL (ref 1.7–2.4)

## 2023-05-14 SURGERY — THORACOSCOPY, ROBOT-ASSISTED
Anesthesia: General | Site: Chest | Laterality: Right

## 2023-05-14 MED ORDER — FENTANYL CITRATE PF 50 MCG/ML IJ SOSY
25.0000 ug | PREFILLED_SYRINGE | INTRAMUSCULAR | Status: DC | PRN
  Administered 2023-05-14: 50 ug via INTRAVENOUS
  Filled 2023-05-14: qty 1

## 2023-05-14 MED ORDER — LACTATED RINGERS IV SOLN: INTRAVENOUS | Status: DC

## 2023-05-14 MED ORDER — 0.9 % SODIUM CHLORIDE (POUR BTL) OPTIME
TOPICAL | Status: DC | PRN
  Administered 2023-05-14: 1000 mL

## 2023-05-14 MED ORDER — FENTANYL CITRATE (PF) 250 MCG/5ML IJ SOLN
INTRAMUSCULAR | Status: DC | PRN
  Administered 2023-05-14: 50 ug via INTRAVENOUS
  Administered 2023-05-14: 150 ug via INTRAVENOUS
  Administered 2023-05-14: 50 ug via INTRAVENOUS

## 2023-05-14 MED ORDER — SENNOSIDES-DOCUSATE SODIUM 8.6-50 MG PO TABS
1.0000 | ORAL_TABLET | Freq: Every day | ORAL | Status: DC
  Administered 2023-05-14: 1 via ORAL
  Filled 2023-05-14 (×2): qty 1

## 2023-05-14 MED ORDER — ONDANSETRON HCL 4 MG/2ML IJ SOLN
INTRAMUSCULAR | Status: DC | PRN
  Administered 2023-05-14: 4 mg via INTRAVENOUS

## 2023-05-14 MED ORDER — ROCURONIUM BROMIDE 10 MG/ML (PF) SYRINGE
PREFILLED_SYRINGE | INTRAVENOUS | Status: DC | PRN
  Administered 2023-05-14: 30 mg via INTRAVENOUS
  Administered 2023-05-14: 70 mg via INTRAVENOUS

## 2023-05-14 MED ORDER — ORAL CARE MOUTH RINSE: 15.0000 mL | Freq: Once | OROMUCOSAL | Status: AC

## 2023-05-14 MED ORDER — ACETAMINOPHEN 160 MG/5ML PO SOLN: 1000.0000 mg | Freq: Four times a day (QID) | ORAL | Status: DC

## 2023-05-14 MED ORDER — ENOXAPARIN SODIUM 40 MG/0.4ML IJ SOSY
40.0000 mg | PREFILLED_SYRINGE | Freq: Every day | INTRAMUSCULAR | Status: DC
  Administered 2023-05-14 – 2023-05-15 (×2): 40 mg via SUBCUTANEOUS
  Filled 2023-05-14 (×4): qty 0.4

## 2023-05-14 MED ORDER — TRAMADOL HCL 50 MG PO TABS
50.0000 mg | ORAL_TABLET | Freq: Four times a day (QID) | ORAL | Status: DC | PRN
  Filled 2023-05-14: qty 2

## 2023-05-14 MED ORDER — BUPIVACAINE LIPOSOME 1.3 % IJ SUSP
INTRAMUSCULAR | Status: AC
  Filled 2023-05-14: qty 20

## 2023-05-14 MED ORDER — CEFAZOLIN SODIUM-DEXTROSE 2-4 GM/100ML-% IV SOLN
2.0000 g | Freq: Three times a day (TID) | INTRAVENOUS | Status: AC
  Administered 2023-05-14 – 2023-05-15 (×2): 2 g via INTRAVENOUS
  Filled 2023-05-14 (×2): qty 100

## 2023-05-14 MED ORDER — FENTANYL CITRATE (PF) 250 MCG/5ML IJ SOLN
INTRAMUSCULAR | Status: AC
  Filled 2023-05-14: qty 5

## 2023-05-14 MED ORDER — MIDAZOLAM HCL 2 MG/2ML IJ SOLN
INTRAMUSCULAR | Status: AC
  Filled 2023-05-14: qty 2

## 2023-05-14 MED ORDER — BUPIVACAINE HCL (PF) 0.5 % IJ SOLN
INTRAMUSCULAR | Status: AC
  Filled 2023-05-14: qty 30

## 2023-05-14 MED ORDER — ACETAMINOPHEN 500 MG PO TABS
1000.0000 mg | ORAL_TABLET | Freq: Four times a day (QID) | ORAL | Status: DC
  Administered 2023-05-14 – 2023-05-16 (×6): 1000 mg via ORAL
  Filled 2023-05-14 (×9): qty 2

## 2023-05-14 MED ORDER — ACETAMINOPHEN 10 MG/ML IV SOLN
INTRAVENOUS | Status: DC | PRN
Start: 2023-05-14 — End: 2023-05-14
  Administered 2023-05-14: 1000 mg via INTRAVENOUS

## 2023-05-14 MED ORDER — LIDOCAINE 2% (20 MG/ML) 5 ML SYRINGE
INTRAMUSCULAR | Status: DC | PRN
  Administered 2023-05-14: 60 mg via INTRAVENOUS

## 2023-05-14 MED ORDER — PHENYLEPHRINE 80 MCG/ML (10ML) SYRINGE FOR IV PUSH (FOR BLOOD PRESSURE SUPPORT)
PREFILLED_SYRINGE | INTRAVENOUS | Status: DC | PRN
  Administered 2023-05-14 (×7): 80 ug via INTRAVENOUS

## 2023-05-14 MED ORDER — PROPOFOL 10 MG/ML IV BOLUS
INTRAVENOUS | Status: AC
  Filled 2023-05-14: qty 20

## 2023-05-14 MED ORDER — CHLORHEXIDINE GLUCONATE 0.12 % MT SOLN
OROMUCOSAL | Status: AC
  Filled 2023-05-14: qty 15

## 2023-05-14 MED ORDER — GABAPENTIN 300 MG PO CAPS
300.0000 mg | ORAL_CAPSULE | Freq: Every day | ORAL | Status: DC
  Administered 2023-05-14 – 2023-05-15 (×2): 300 mg via ORAL
  Filled 2023-05-14 (×2): qty 1

## 2023-05-14 MED ORDER — ONDANSETRON HCL 4 MG/2ML IJ SOLN: 4.0000 mg | Freq: Four times a day (QID) | INTRAMUSCULAR | Status: DC | PRN

## 2023-05-14 MED ORDER — SODIUM CHLORIDE 0.9 % IV SOLN
INTRAVENOUS | Status: AC | PRN
  Administered 2023-05-14: 2000 mL via INTRAMUSCULAR

## 2023-05-14 MED ORDER — MIDAZOLAM HCL 2 MG/2ML IJ SOLN
INTRAMUSCULAR | Status: DC | PRN
  Administered 2023-05-14: 2 mg via INTRAVENOUS

## 2023-05-14 MED ORDER — DEXAMETHASONE SODIUM PHOSPHATE 10 MG/ML IJ SOLN
INTRAMUSCULAR | Status: DC | PRN
  Administered 2023-05-14: 10 mg via INTRAVENOUS

## 2023-05-14 MED ORDER — SODIUM CHLORIDE FLUSH 0.9 % IV SOLN
INTRAVENOUS | Status: DC | PRN
  Administered 2023-05-14: 100 mL

## 2023-05-14 MED ORDER — KETOROLAC TROMETHAMINE 15 MG/ML IJ SOLN
15.0000 mg | Freq: Four times a day (QID) | INTRAMUSCULAR | Status: AC
  Administered 2023-05-14 – 2023-05-16 (×8): 15 mg via INTRAVENOUS
  Filled 2023-05-14 (×8): qty 1

## 2023-05-14 MED ORDER — PROPOFOL 10 MG/ML IV BOLUS
INTRAVENOUS | Status: DC | PRN
Start: 2023-05-14 — End: 2023-05-14
  Administered 2023-05-14: 200 mg via INTRAVENOUS

## 2023-05-14 MED ORDER — BISACODYL 5 MG PO TBEC
10.0000 mg | DELAYED_RELEASE_TABLET | Freq: Every day | ORAL | Status: DC
  Administered 2023-05-16: 10 mg via ORAL
  Filled 2023-05-14: qty 2

## 2023-05-14 MED ORDER — PHENYLEPHRINE HCL-NACL 20-0.9 MG/250ML-% IV SOLN
INTRAVENOUS | Status: DC | PRN
  Administered 2023-05-14: 25 ug/min via INTRAVENOUS

## 2023-05-14 MED ORDER — GABAPENTIN 300 MG PO CAPS: 300.0000 mg | ORAL_CAPSULE | Freq: Two times a day (BID) | ORAL | Status: DC

## 2023-05-14 MED ORDER — ACETAMINOPHEN 10 MG/ML IV SOLN
INTRAVENOUS | Status: AC
  Filled 2023-05-14: qty 100

## 2023-05-14 MED ORDER — OXYCODONE HCL 5 MG PO TABS
5.0000 mg | ORAL_TABLET | ORAL | Status: DC | PRN
  Administered 2023-05-14: 5 mg via ORAL
  Administered 2023-05-15 – 2023-05-16 (×4): 10 mg via ORAL
  Filled 2023-05-14 (×3): qty 2
  Filled 2023-05-14 (×3): qty 1

## 2023-05-14 MED ORDER — SODIUM CHLORIDE 0.9 % IV SOLN: INTRAVENOUS | Status: DC

## 2023-05-14 MED ORDER — SUGAMMADEX SODIUM 200 MG/2ML IV SOLN
INTRAVENOUS | Status: DC | PRN
  Administered 2023-05-14: 182 mg via INTRAVENOUS

## 2023-05-14 MED ORDER — PANTOPRAZOLE SODIUM 40 MG PO TBEC
40.0000 mg | DELAYED_RELEASE_TABLET | Freq: Every day | ORAL | Status: DC
  Administered 2023-05-15 – 2023-05-16 (×2): 40 mg via ORAL
  Filled 2023-05-14 (×3): qty 1

## 2023-05-14 MED ORDER — HYDROMORPHONE HCL 1 MG/ML IJ SOLN: 0.2500 mg | INTRAMUSCULAR | Status: DC | PRN

## 2023-05-14 MED ORDER — CHLORHEXIDINE GLUCONATE 0.12 % MT SOLN
15.0000 mL | Freq: Once | OROMUCOSAL | Status: AC
  Administered 2023-05-14: 15 mL via OROMUCOSAL

## 2023-05-14 SURGICAL SUPPLY — 81 items
ADH SKN CLS APL DERMABOND .7 (GAUZE/BANDAGES/DRESSINGS) ×2
APPLIER CLIP ROT 10 11.4 M/L (STAPLE)
APR CLP MED LRG 11.4X10 (STAPLE)
BLADE CLIPPER SURG (BLADE) ×2 IMPLANT
CANISTER SUCT 3000ML PPV (MISCELLANEOUS) ×2 IMPLANT
CANNULA REDUCER 12-8 DVNC XI (CANNULA) ×4 IMPLANT
CATH THORACIC 28FR (CATHETERS) IMPLANT
CLEANER TIP ELECTROSURG 2X2 (MISCELLANEOUS) IMPLANT
CLIP APPLIE ROT 10 11.4 M/L (STAPLE) IMPLANT
CNTNR URN SCR LID CUP LEK RST (MISCELLANEOUS) ×10 IMPLANT
CONN ST 1/4X3/8 BEN (MISCELLANEOUS) IMPLANT
CONT SPEC 4OZ STRL OR WHT (MISCELLANEOUS) ×18
DEFOGGER SCOPE WARMER CLEARIFY (MISCELLANEOUS) ×2 IMPLANT
DERMABOND ADVANCED .7 DNX12 (GAUZE/BANDAGES/DRESSINGS) IMPLANT
DRAIN CHANNEL 28F RND 3/8 FF (WOUND CARE) IMPLANT
DRAIN CHANNEL 32F RND 10.7 FF (WOUND CARE) IMPLANT
DRAPE ARM DVNC X/XI (DISPOSABLE) ×8 IMPLANT
DRAPE COLUMN DVNC XI (DISPOSABLE) ×2 IMPLANT
DRAPE CV SPLIT W-CLR ANES SCRN (DRAPES) ×2 IMPLANT
DRAPE INCISE IOBAN 66X45 STRL (DRAPES) IMPLANT
DRAPE ORTHO SPLIT 77X108 STRL (DRAPES) ×2
DRAPE SURG ORHT 6 SPLT 77X108 (DRAPES) ×2 IMPLANT
ELECT BLADE 6.5 EXT (BLADE) ×2 IMPLANT
ELECT REM PT RETURN 9FT ADLT (ELECTROSURGICAL) ×2
ELECTRODE REM PT RTRN 9FT ADLT (ELECTROSURGICAL) ×2 IMPLANT
FORCEPS BPLR FENES DVNC XI (FORCEP) IMPLANT
FORCEPS BPLR LNG DVNC XI (INSTRUMENTS) IMPLANT
GAUZE SPONGE 4X4 12PLY STRL (GAUZE/BANDAGES/DRESSINGS) ×2 IMPLANT
GLOVE SS BIOGEL STRL SZ 7.5 (GLOVE) ×4 IMPLANT
GOWN STRL REUS W/ TWL LRG LVL3 (GOWN DISPOSABLE) ×4 IMPLANT
GOWN STRL REUS W/ TWL XL LVL3 (GOWN DISPOSABLE) ×4 IMPLANT
GOWN STRL REUS W/TWL 2XL LVL3 (GOWN DISPOSABLE) ×2 IMPLANT
GOWN STRL REUS W/TWL LRG LVL3 (GOWN DISPOSABLE) ×4
GOWN STRL REUS W/TWL XL LVL3 (GOWN DISPOSABLE) ×4
GRASPER TIP-UP FEN DVNC XI (INSTRUMENTS) IMPLANT
HEMOSTAT SURGICEL 2X14 (HEMOSTASIS) ×4 IMPLANT
IRRIGATION STRYKERFLOW (MISCELLANEOUS) ×2 IMPLANT
IRRIGATOR STRYKERFLOW (MISCELLANEOUS) ×2
IV NS 1000ML (IV SOLUTION) ×2
IV NS 1000ML BAXH (IV SOLUTION) ×2 IMPLANT
KIT SUCTION CATH 14FR (SUCTIONS) IMPLANT
KIT TURNOVER KIT B (KITS) ×2 IMPLANT
MARKER SKIN DUAL TIP RULER LAB (MISCELLANEOUS) IMPLANT
NDL HYPO 25GX1X1/2 BEV (NEEDLE) ×2 IMPLANT
NDL SPNL 22GX3.5 QUINCKE BK (NEEDLE) ×2 IMPLANT
NEEDLE HYPO 25GX1X1/2 BEV (NEEDLE) ×2 IMPLANT
NEEDLE SPNL 22GX3.5 QUINCKE BK (NEEDLE) ×2 IMPLANT
NS IRRIG 1000ML POUR BTL (IV SOLUTION) ×2 IMPLANT
PACK CHEST (CUSTOM PROCEDURE TRAY) ×2 IMPLANT
PAD ARMBOARD 7.5X6 YLW CONV (MISCELLANEOUS) ×4 IMPLANT
RELOAD STAPLE 45 3.5 BLU DVNC (STAPLE) IMPLANT
RELOAD STAPLER 3.5X45 BLU DVNC (STAPLE) ×22 IMPLANT
SCISSORS LAP 5X35 DISP (ENDOMECHANICALS) IMPLANT
SEAL UNIV 5-12 XI (MISCELLANEOUS) ×8 IMPLANT
SET TRI-LUMEN FLTR TB AIRSEAL (TUBING) ×2 IMPLANT
SPONGE INTESTINAL PEANUT (DISPOSABLE) IMPLANT
SPONGE TONSIL 1.5 RFD TRIPLE (SPONGE) ×2 IMPLANT
STAPLER 45 SUREFORM DVNC (STAPLE) IMPLANT
STAPLER RELOAD 3.5X45 BLU DVNC (STAPLE) ×22
SUT PDS AB 3-0 SH 27 (SUTURE) IMPLANT
SUT PROLENE 4 0 RB 1 (SUTURE)
SUT PROLENE 4-0 RB1 .5 CRCL 36 (SUTURE) IMPLANT
SUT SILK 1 MH (SUTURE) ×2 IMPLANT
SUT SILK 2 0 SH (SUTURE) IMPLANT
SUT SILK 2 0SH CR/8 30 (SUTURE) IMPLANT
SUT SILK 3 0SH CR/8 30 (SUTURE) IMPLANT
SUT VIC AB 1 CTX 36 (SUTURE) ×2
SUT VIC AB 1 CTX36XBRD ANBCTR (SUTURE) ×2 IMPLANT
SUT VIC AB 2-0 CTX 36 (SUTURE) ×2 IMPLANT
SUT VIC AB 3-0 X1 27 (SUTURE) ×4 IMPLANT
SUT VICRYL 0 TIES 12 18 (SUTURE) ×2 IMPLANT
SUT VICRYL 0 UR6 27IN ABS (SUTURE) ×4 IMPLANT
SUT VICRYL 2 TP 1 (SUTURE) IMPLANT
SYR 20CC LL (SYRINGE) ×4 IMPLANT
SYSTEM SAHARA CHEST DRAIN ATS (WOUND CARE) ×2 IMPLANT
TAPE CLOTH 4X10 WHT NS (GAUZE/BANDAGES/DRESSINGS) ×2 IMPLANT
TAPE CLOTH SURG 4X10 WHT LF (GAUZE/BANDAGES/DRESSINGS) IMPLANT
TOWEL GREEN STERILE (TOWEL DISPOSABLE) ×2 IMPLANT
TRAY FOLEY MTR SLVR 16FR STAT (SET/KITS/TRAYS/PACK) ×2 IMPLANT
TROCAR PORT AIRSEAL 12X150 (TUBING) ×2 IMPLANT
WATER STERILE IRR 1000ML POUR (IV SOLUTION) ×2 IMPLANT

## 2023-05-14 NOTE — Progress Notes (Signed)
Pt had chest tube placed today for pneumothorax, he c/o worsening pain and SOB. Pain in right upper back and right anterior chest. Per pt he was breathing better initially after chest tube got placed, but it got slightly worse now. No fever, SpO2 98% on RA, RR in 10s and low 20s, BP 149/71, HR in 50s-60s. Breath sounds slightly diminished to his right side comparing to left, not significant.  Pt in no distress.  Julian Reil, MD notified.  New orders placed.

## 2023-05-14 NOTE — Progress Notes (Signed)
PROGRESS NOTE    Alex Scott  ZOX:[id number] DOB: May 07, 1996 DOA: 05/12/2023 PCP: Pcp, No    Brief Narrative:   Alex Scott is a 27 y.o. male with past medical history significant for THC use, vaping who presented to [facility] ED on 8/28 for sudden onset sharp right-sided chest pain and shortness of breath.  Patient reports onset hour prior to ED arrival with associated increased pain on deep inspiration.     Recently admitted June 2024 for spontaneous pneumothorax, did not require chest tube or surgical procedure and following discharge follow-up x-ray at urgent care showed resolution.   In the ED, temperature 98.6 F, HR 80, RR 20, BP 155/87, SpO2 98% on room air.  WBC 9.8, hemoglobin 14.8, platelet count 209.  Sodium 133, potassium 3.4, chloride 99, CO2 23, glucose 101, BUN 13, creatinine 0.98.  Chest x-ray with increase in right-sided pneumothorax when compared with the prior exam measuring approximately 2.4 cm.  EDP discussed with PCCM who recommended admission to the hospitalist service and no indication for chest tube at this time.  Patient was transferred to [facility] under the hospitalist service for further evaluation and management of recurrent spontaneous pneumothorax.  Assessment & Plan:   Right pneumothorax, recurrent Patient presenting to the ED with acute onset right sided chest pain associated with shortness of breath, increased pain on deep inspiration.  Complicated by his history of vaping, THC use and heavy lifting at work.  Patient with previous pneumothorax June 2024, treated conservatively with oxygen with repeat chest x-ray outpatient at urgent care showing resolution. -- PCCM/CTS following, appreciate assistance -- Continue supplemental oxygen, maintain SpO2 greater than 94% -- N.p.o. for planned VATS procedure today   THC use disorder Hx vaping Discussed need for complete cessation/abstinence given his recurrent episodes of pneumothorax.   DVT  prophylaxis: Pneumatic SCD boots to accompany all patients to O.R. Start: 05/13/23 1949 SCDs Start: 05/13/23 0226    Code Status: Full Code Family Communication:   Disposition Plan:  Level of care: Telemetry Status is: Inpatient Remains inpatient appropriate because: Pending VATS procedure per CTS    Consultants:  PCCM Cardiothoracic surgery  Procedures:  VATS: Pending 8/29  Antimicrobials:  None   Subjective: Patient seen examined at bedside, resting calmly.  Lying in bed.  About to go down to the preop area for pending VATS procedure this morning.  Multiple family members present at bedside.  No specific complaints, questions, concerns.  Denies headache, no visual changes, no chest pain, no palpitations, no shortness of breath, no abdominal pain, no fever/chills/night sweats, no nausea/vomiting/diarrhea, no focal weakness, no fatigue, no cough/congestion, no paresthesias.  No acute events overnight per nursing staff.  Objective: Vitals:   05/13/23 0749 05/13/23 1610 05/13/23 2223 05/14/23 0503  BP: 133/76 129/88 136/71 125/70  Pulse: 62 83 85 71  Resp: 16 16    Temp: 98.5 F (36.9 C) 98.7 F (37.1 C) 98 F (36.7 C) 98.4 F (36.9 C)  TempSrc: Oral Oral  Oral  SpO2: 100% 98% 96% 99%  Weight:      Height:       No intake or output data in the 24 hours ending 05/14/23 0638 Filed Weights   05/12/23 2257 05/13/23 0500  Weight: 88.5 kg 91 kg    Examination:  Physical Exam: GEN: NAD, alert and oriented x 3, wd/wn HEENT: NCAT, PERRL, EOMI, sclera clear, MMM PULM: CTAB w/o wheezes/crackles, normal respiratory effort, on Venturi mask with SpO2 99% CV: RRR  w/o M/G/R GI: abd soft, NTND, NABS, no R/G/M MSK: no peripheral edema, muscle strength globally intact 5/5 bilateral upper/lower extremities NEURO: CN II-XII intact, no focal deficits, sensation to light touch intact PSYCH: normal mood/affect Integumentary: dry/intact, no rashes or wounds    Data Reviewed: I  have personally reviewed following labs and imaging studies  CBC: Recent Labs  Lab 05/13/23 0006 05/13/23 2036 05/14/23 0535  WBC 9.8 5.8 7.0  NEUTROABS 7.2  --  2.6  HGB 14.8 15.2 15.4  HCT 40.2 42.4 42.7  MCV 80.2 83.0 80.6  PLT 209 217 213   Basic Metabolic Panel: Recent Labs  Lab 05/13/23 0006 05/14/23 0535  NA 133* 136  K 3.4* 4.0  CL 99 103  CO2 23 24  GLUCOSE 101* 101*  BUN 13 14  CREATININE 0.98 1.09  CALCIUM 9.2 9.1  MG  --  2.0   GFR: Estimated Creatinine Clearance: 116.5 mL/min (by C-G formula based on SCr of 1.09 mg/dL). Liver Function Tests: Recent Labs  Lab 05/14/23 0535  AST 26  ALT 34  ALKPHOS 44  BILITOT 1.2  PROT 7.3  ALBUMIN 4.0   No results for input(s): "LIPASE", "AMYLASE" in the last 168 hours. No results for input(s): "AMMONIA" in the last 168 hours. Coagulation Profile: No results for input(s): "INR", "PROTIME" in the last 168 hours. Cardiac Enzymes: No results for input(s): "CKTOTAL", "CKMB", "CKMBINDEX", "TROPONINI" in the last 168 hours. BNP (last 3 results) No results for input(s): "PROBNP" in the last 8760 hours. HbA1C: No results for input(s): "HGBA1C" in the last 72 hours. CBG: No results for input(s): "GLUCAP" in the last 168 hours. Lipid Profile: No results for input(s): "CHOL", "HDL", "LDLCALC", "TRIG", "CHOLHDL", "LDLDIRECT" in the last 72 hours. Thyroid Function Tests: No results for input(s): "TSH", "T4TOTAL", "FREET4", "T3FREE", "THYROIDAB" in the last 72 hours. Anemia Panel: No results for input(s): "VITAMINB12", "FOLATE", "FERRITIN", "TIBC", "IRON", "RETICCTPCT" in the last 72 hours. Sepsis Labs: No results for input(s): "PROCALCITON", "LATICACIDVEN" in the last 168 hours.  Recent Results (from the past 240 hour(s))  Surgical pcr screen     Status: None   Collection Time: 05/13/23 11:54 PM   Specimen: Nasal Mucosa; Nasal Swab  Result Value Ref Range Status   MRSA, PCR NEGATIVE NEGATIVE Final   Staphylococcus  aureus NEGATIVE NEGATIVE Final    Comment: (NOTE) The Xpert SA Assay (FDA approved for NASAL specimens in patients 45 years of age and older), is one component of a comprehensive surveillance program. It is not intended to diagnose infection nor to guide or monitor treatment. Performed at [facility], [address] [place], [place] [address]          Radiology Studies: DG CHEST PORT 1 VIEW  Result Date: 05/13/2023 CLINICAL DATA:  Pneumothorax EXAM: PORTABLE CHEST 1 VIEW COMPARISON:  Radiograph 05/12/2023 FINDINGS: Moderate volume RIGHT pneumothorax again demonstrated. Slight increase in volume with the pleural edge 15 mm from the lateral chest wall compared to 7 mm on prior. The apical pleural edge measures 13 mm from chest wall compared to 16 mm. No mediastinal shift.  No rib fracture. IMPRESSION: Slight increase in volume of moderate RIGHT pneumothorax compared to 1 day prior. Electronically Signed   By: Genevive Bi M.D.   On: 05/13/2023 08:42   DG Chest 2 View  Result Date: 05/12/2023 CLINICAL DATA:  Shortness of breath and chest pain, history of spontaneous pneumothorax 1 month ago EXAM: CHEST - 2 VIEW COMPARISON:  02/26/2023  FINDINGS: Cardiac shadow is within normal limits. Left lung is clear. Right lung is well aerated although and apical pneumothorax is seen with approximately 2.4 cm of apical excursion increased when compared with the prior exam. No rib abnormality is noted. No other focal abnormality is seen. IMPRESSION: Increase in right-sided pneumothorax when compared with the prior exam. Critical Value/emergent results were called by telephone at the time of interpretation on 05/12/2023 at 11:17 pm to Dr. Rubin Payor , who verbally acknowledged these results. Electronically Signed   By: Alcide Clever M.D.   On: 05/12/2023 23:18        Scheduled Meds: Continuous Infusions:   ceFAZolin (ANCEF) IV       LOS: 1 day    Time spent: 48 minutes spent on chart  review, discussion with nursing staff, consultants, updating family and interview/physical exam; more than 50% of that time was spent in counseling and/or coordination of care.    Alvira Philips [place], DO [facility] Available via Epic secure chat 7am-7pm After these hours, please refer to coverage provider listed on [email or web address] 05/14/2023, 6:38 AM

## 2023-05-14 NOTE — Plan of Care (Signed)

## 2023-05-14 NOTE — Interval H&P Note (Signed)
History and Physical Interval Note:  05/14/2023 7:25 AM  Alex Scott  has presented today for surgery, with the diagnosis of RECURRENT RIGHT PTX.  The various methods of treatment have been discussed with the patient and family. After consideration of risks, benefits and other options for treatment, the patient has consented to  Procedure(s): XI ROBOTIC ASSISTED THORACOSCOPY (Right) STAPLING OF BLEBS (Right) PLEURADESIS (Right) as a surgical intervention.  The patient's history has been reviewed, patient examined, no change in status, stable for surgery.  I have reviewed the patient's chart and labs.  Questions were answered to the patient's satisfaction.     Loreli Slot

## 2023-05-14 NOTE — Progress Notes (Signed)
Dr. Dorris Fetch notified that patient did not receive a covid test prior to surgery.  He stated he does not need his patient to be covid tested.  Dr. Sampson Goon, anesthesiologist, also made aware.

## 2023-05-14 NOTE — Transfer of Care (Signed)
Immediate Anesthesia Transfer of Care Note  Patient: Alex Scott  Procedure(s) Performed: XI ROBOTIC ASSISTED THORACOSCOPY (Right: Chest) STAPLING OF BLEBS (Right) MECHANICAL PLEURADESIS (Right) INTERCOSTAL NERVE BLOCK (Right: Chest)  Patient Location: PACU  Anesthesia Type:General  Level of Consciousness: awake, alert , and oriented  Airway & Oxygen Therapy: Patient Spontanous Breathing and Patient connected to nasal cannula oxygen  Post-op Assessment: Report given to RN and Post -op Vital signs reviewed and stable  Post vital signs: Reviewed and stable  Last Vitals:  Vitals Value Taken Time  BP 131/82 05/14/23 1006  Temp    Pulse 80 05/14/23 1009  Resp 19 05/14/23 1009  SpO2 98 % 05/14/23 1009  Vitals shown include unfiled device data.  Last Pain:  Vitals:   05/14/23 0707  TempSrc: Oral  PainSc:       Patients Stated Pain Goal: 0 (05/13/23 1100)  Complications: No notable events documented.

## 2023-05-14 NOTE — Progress Notes (Signed)
[facility] Pre-op Review  Pre-op checklist: yes completed  NPO: yes  Labs: Has t/s completed, labs stable, glucose slightly elevated  Consent: had orders,   H&P: 05/13/23, consult note 05/13/23  Vitals: stable  O2 requirements: required venturi mask on 10L yesterday - still current using for pressure   MAR/PTA review: none  IV: 20g R forearm  Floor nurse name:  Sherron Ales  Additional info:  HX vaping stopped after first pneumothorax COVID test not needed per dr Dorris Fetch ANCEF 2g preop

## 2023-05-14 NOTE — Discharge Instructions (Addendum)
Discharge Instructions:  1. You may shower, please wash incisions daily with soap and water and keep dry.  If you wish to cover wounds with dressing you may do so but please keep clean and change daily.  No tub baths or swimming until incisions have completely healed.  If your incisions become red or develop any drainage please call our office at [phone]  2. No Driving until cleared by Dr. Sunday Corn office and you are no longer using narcotic pain medications  3. Avoid smoking and vapeing tobacco or marijuana products.  4. Fever of 101.5 for at least 24 hours with no source, please contact our office at [phone]  5. Activity- up as tolerated, please walk at least 3 times per day.  Avoid strenuous activity  6. If any questions or concerns arise, please do not hesitate to contact our office at [phone]

## 2023-05-14 NOTE — Discharge Summary (Addendum)
Physician Discharge Summary  Patient ID: Alex Scott MRN: [id number] DOB/AGE: [birth year] 26 y.o.  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  Had a spontaneous pneumothorax in June managed without chest tube.  Now presents with recurrent right pneumothorax.  Again limited with no need for chest tube. I reviewed the records, CXR and CT images and examined Alex Scott.   Discussed the issue with him.  After a 2nd pneumothorax there is at least a 50% chance of additional occurrences, and in my own experience it is significantly higher than that.  Surgery is indicated after a recurrent pneumothorax.   I recommended we proceed with a Right robotic assisted bleb resection and pleural stripping/ abrasion for management of his recurrent pneumothorax.  I discussed the general nature of the procedure, including the need for general anesthesia, the incisions to be used, the use of the surgical robot and the use of a drainage tube postoperatively with Alex Scott.  We discussed the expected hospital stay, overall recovery and short and long term outcomes. I informed him of the indications, risks, benefits and alternatives.  He understands the risks include, but are not limited to death DVT/PE, bleeding, possible need for transfusion, infections, prolonged air leaks as well s other organ system dysfunction including respiratory, renal, or GI complications.  He is low risk as a young healthy male.     He accepts the risks and agrees to proceed.    Hospital Course:  Alex Scott was taken to the OR on 05/14/23 where robotic-assisted thoracoscopy was performed with wedge resection of several chains of small blebs from the periphery of the right upper and lower lobes. Right apical pleurectomy and mechanical pleural abrasions were also carried out along with right intercostal nerve block. Following the procedures, he was awakened, extubated, and transferred to the PACU.  The postop chest x-ray showed good expansion of the right lung with a small airspace laterally felt to be related to the staple lines from the wedge resections.  He did not have any air leak.  On postop day 1, the chest tube was transition to waterseal.  Pain was controlled with scheduled IV ketorolac and oral acetaminophen as well as as needed opioids.  A lidocaine patch was added on postop day 1.  The patients CXR remained stable on water seal.  He did not exhibit evidence of air leak.  His chest tube was removed without difficulty.  There was not significant pneumothorax on repeat CXR.  He was felt stable for discharge home today.  Consults: None  Significant Diagnostic Studies:  CLINICAL DATA:  Pneumothorax   EXAM: CT CHEST WITH CONTRAST   TECHNIQUE: Multidetector CT imaging of the chest was performed during intravenous contrast administration.   RADIATION DOSE REDUCTION: This exam was performed according to the departmental dose-optimization program which includes automated exposure control, adjustment of the mA and/or kV according to patient size and/or use of iterative reconstruction technique.   CONTRAST:  75mL OMNIPAQUE IOHEXOL 350 MG/ML SOLN   COMPARISON:  None Available.   FINDINGS: Cardiovascular: No significant vascular findings. Normal heart size.  No pericardial effusion.   Mediastinum/Nodes: No enlarged mediastinal, hilar, or axillary lymph nodes. Thyroid gland, trachea, and esophagus demonstrate no significant findings.   Lungs/Pleura: Small right  pneumothorax. Lungs are clear. No pleural effusion. No pneumothorax on the left. No central obstructing lesion.   Upper Abdomen: No acute abnormality.   Musculoskeletal: No chest wall abnormality. No acute or significant osseous findings.   IMPRESSION: 1. Small right pneumothorax. No radiographic evidence of tension physiology.     Electronically Signed   By: Helyn Numbers M.D.   On: 02/24/2023 20:53    Treatments: Surgery Operative Report    DATE OF PROCEDURE: 05/14/2023   PREOPERATIVE DIAGNOSIS:  Recurrent right spontaneous pneumothorax.   POSTOPERATIVE DIAGNOSIS:  Recurrent right spontaneous pneumothorax.   PROCEDURE:  Xi robotic-assisted right thoracoscopy, stapling of blebs.  Mechanical pleurodesis, intercostal nerve blocks levels 3 through 10.   SURGEON:  Salvatore Decent. Dorris Fetch, MD   ASSISTANT:  Jillyn Hidden, PA   ANESTHESIA:  General.   FINDINGS:  Multiple small blebs mostly along the edges of the lobes inferiorly, questionable apical bleb.  Additional small blebs on the lateral aspect of upper lobe.   CLINICAL NOTE:  The patient is a 27 year old young man who presented with a recurrent right spontaneous pneumothorax.  He previously had a pneumothorax a few months prior.  CT scan had shown no dominant blebs, but given the recurrence he was advised to  undergo surgical intervention to decrease the risk of continued recurrences.  The indications, risks, benefits, and alternatives were discussed in detail with the patient.  He understood and accepted the risks and agreed to proceed.  Discharge Exam: Blood pressure (!) 156/81, pulse 70, temperature 98 F (36.7 C), temperature source Oral, resp. rate 19, height 5\' 10"  (1.778 m), weight 91 kg, SpO2 96%.   General appearance: alert, cooperative, and no distress Heart: regular rate and rhythm Lungs: clear to auscultation bilaterally Wound: clean and dry  Discharge disposition: 01-Home or Self Care   Allergies  as of 05/16/2023   No Known Allergies      Medication List     TAKE these medications    acetaminophen 500 MG tablet Commonly known as: TYLENOL Take 500 mg by mouth 2 (two) times daily as needed for moderate pain.   oxyCODONE 5 MG immediate release tablet Commonly known as: Oxy IR/ROXICODONE Take 1 tablet (5 mg total) by mouth every 4 (four) hours as needed for moderate pain.        Follow-up Information     [facility] Go on 05/26/2023.   Specialty: Cardiothoracic Surgery Why: Your appointment is at 3 PM.  Please obtain a chest x-ray 1 hour prior to the appointment at [facility] located at [address] Contact information: [address] [place], [address] [place] [address] [phone]        [facility] Follow up on 05/26/2023.   Why: To get chest xray at 2:00PM Contact information: [address] [place] [address]                  Signed:  Lowella Dandy, PA-C 2:48 PM 05/16/23

## 2023-05-14 NOTE — Anesthesia Postprocedure Evaluation (Signed)
Anesthesia Post Note  Patient: [name]  Procedure(s) Performed: XI ROBOTIC ASSISTED THORACOSCOPY (Right: Chest) STAPLING OF BLEBS (Right) MECHANICAL PLEURADESIS (Right) INTERCOSTAL NERVE BLOCK (Right: Chest)     Patient location during evaluation: PACU Anesthesia Type: General Level of consciousness: awake and alert Pain management: pain level controlled Vital Signs Assessment: post-procedure vital signs reviewed and stable Respiratory status: spontaneous breathing, nonlabored ventilation and respiratory function stable Cardiovascular status: blood pressure returned to baseline and stable Postop Assessment: no apparent nausea or vomiting Anesthetic complications: no  No notable events documented.  Last Vitals:  Vitals:   05/14/23 1045 05/14/23 1123  BP: 113/84 135/82  Pulse: 67 72  Resp: 19 18  Temp: 36.6 C 36.9 C  SpO2: 98% 100%    Last Pain:  Vitals:   05/14/23 1123  TempSrc: Oral  PainSc:                  Alex Scott,W. EDMOND

## 2023-05-14 NOTE — Progress Notes (Signed)
IV has infiltrated, mild edema at the site, skin blanchable, color appropriate, no signs of phlebitis or pain at the site.  Pt had Sodium Chloride infusing at the time.  Pharmacy notified.

## 2023-05-14 NOTE — Anesthesia Procedure Notes (Signed)
Procedure Name: Intubation Date/Time: 05/14/2023 8:06 AM  Performed by: Marena Chancy, CRNAPre-anesthesia Checklist: Patient identified, Emergency Drugs available, Suction available and Patient being monitored Patient Re-evaluated:Patient Re-evaluated prior to induction Oxygen Delivery Method: Circle System Utilized Preoxygenation: Pre-oxygenation with 100% oxygen Induction Type: IV induction Ventilation: Mask ventilation without difficulty Laryngoscope Size: Mac and 4 Grade View: Grade II Tube type: Oral Endobronchial tube: Left, Double lumen EBT, EBT position confirmed by fiberoptic bronchoscope and EBT position confirmed by auscultation and 39 Fr Number of attempts: 1 Airway Equipment and Method: Stylet and Oral airway Placement Confirmation: ETT inserted through vocal cords under direct vision, positive ETCO2 and breath sounds checked- equal and bilateral Tube secured with: Tape Dental Injury: Teeth and Oropharynx as per pre-operative assessment

## 2023-05-14 NOTE — Brief Op Note (Addendum)
05/14/2023  9:54 AM  PATIENT:  Alex Scott  27 y.o. male  PRE-OPERATIVE DIAGNOSIS:  RECURRENT RIGHT SPONTANEOUS PNEUMOTHORAX  POST-OPERATIVE DIAGNOSIS:  RECURRENT RIGHT SPONTANEOUS PNEUMOTHORAX  PROCEDURE:   XI ROBOTIC ASSISTED RIGHT THORACOSCOPY  STAPLING OF BLEBS (Right) MECHANICAL PLEURADESIS (Right) INTERCOSTAL NERVE BLOCKS (Right)  SURGEON:   Loreli Slot, MD - Primary  PHYSICIAN ASSISTANT:  Jillyn Hidden, PA-C  ASSISTANTS: Frances Maywood, RN, Circulator         Bruins, Brooklyn L, Scrub Perso   ANESTHESIA:   general  EBL:  25ml  BLOOD ADMINISTERED:none  DRAINS:  45fr Blake right pleural space    LOCAL MEDICATIONS USED:  Exparel right intercostal block and local at port sites  SPECIMEN:  Pleural bleb wedges from right upper and middle lobes  DISPOSITION OF SPECIMEN:  PATHOLOGY  COUNTS: Correct  DICTATION: .done  PLAN OF CARE: Admit to inpatient   PATIENT DISPOSITION:  PACU - hemodynamically stable.   Delay start of Pharmacological VTE agent (>24hrs) due to surgical blood loss or risk of bleeding: no

## 2023-05-14 NOTE — Hospital Course (Addendum)
History of Present Illness:  27 yo man with history of vaping and smoking marijuana.  Had a spontaneous pneumothorax in June managed without chest tube.  Now presents with recurrent right pneumothorax.  Again limited with no need for chest tube. I reviewed the records, CXR and CT images and examined Alex Scott.   Discussed the issue with him.  After a 2nd pneumothorax there is at least a 50% chance of additional occurrences, and in my own experience it is significantly higher than that.  Surgery is indicated after a recurrent pneumothorax.   I recommended we proceed with a Right robotic assisted bleb resection and pleural stripping/ abrasion for management of his recurrent pneumothorax.  I discussed the general nature of the procedure, including the need for general anesthesia, the incisions to be used, the use of the surgical robot and the use of a drainage tube postoperatively with Alex Scott.  We discussed the expected hospital stay, overall recovery and short and long term outcomes. I informed him of the indications, risks, benefits and alternatives.  He understands the risks include, but are not limited to death DVT/PE, bleeding, possible need for transfusion, infections, prolonged air leaks as well s other organ system dysfunction including respiratory, renal, or GI complications.  He is low risk as a young healthy male.     He accepts the risks and agrees to proceed.   Hospital Course:  Alex Scott was taken to the OR on 05/14/23 where robotic-assisted thoracoscopy was performed with wedge resection of several chains of small blebs from the periphery of the right upper and lower lobes. Right apical pleurectomy and mechanical pleural abrasions were also carried out along with right intercostal nerve block. Following the procedures, he was awakened, extubated, and transferred to the PACU.  The postop chest x-ray showed good expansion of the right lung with a small airspace laterally felt to be related to  the staple lines from the wedge resections.  He did not have any air leak.  On postop day 1, the chest tube was transition to waterseal.  Pain was controlled with scheduled IV ketorolac and oral acetaminophen as well as as needed opioids.  A lidocaine patch was added on postop day 1.

## 2023-05-14 NOTE — Op Note (Signed)
NAMECRYSTAL, GENDREAU MEDICAL RECORD NO: [id number] ACCOUNT NO: [id number] DATE OF BIRTH: [birth year] FACILITY: [facility] LOCATION: [facility] PHYSICIAN: Salvatore Decent. Dorris Fetch, MD  Operative Report   DATE OF PROCEDURE: 05/14/2023  PREOPERATIVE DIAGNOSIS:  Recurrent right spontaneous pneumothorax.  POSTOPERATIVE DIAGNOSIS:  Recurrent right spontaneous pneumothorax.  PROCEDURE:   Xi robotic-assisted right thoracoscopy,  Stapling of blebs.   Mechanical pleurodesis. Intercostal nerve blocks levels 3 through 10.  SURGEON:  Salvatore Decent. Dorris Fetch, MD  ASSISTANT:  Jillyn Hidden, PA  ANESTHESIA:  General.  FINDINGS:  Multiple small blebs mostly along the edges of the lobes inferiorly.  Questionable apical bleb.  Additional small blebs on the lateral aspect of upper lobe.  CLINICAL NOTE:  Mr. Ebron is a 27 year old young man who presented with a recurrent right spontaneous pneumothorax.  He previously had a pneumothorax a few months prior.  CT scan had shown no dominant blebs, but given the recurrence he was advised to undergo surgical intervention to decrease the risk of continued recurrences.  The indications, risks, benefits, and alternatives were discussed in detail with the patient.  He understood and accepted the risks and agreed to proceed.  OPERATIVE NOTE:  Mr. Gardner was brought to the preoperative holding area on 05/14/2023.  Anesthesia assured adequate intravenous access and placed an arterial blood pressure monitoring line.  He was taken to the operating room and anesthetized and  intubated.  Sequential compression devices were placed on the calves for DVT prophylaxis.  Intravenous antibiotics were administered.  He was placed in a left lateral decubitus position.  A Bair Hugger was placed for active warming.  The right chest was prepped and draped in the usual sterile fashion.  Single lung ventilation of the left lung was initiated and was tolerated well throughout the procedure.  A timeout  was performed.  A solution containing 20 mL of liposomal bupivacaine, 30 mL of 0.5% bupivacaine and 50 mL of saline was prepared.  This solution was used for intercostal nerve blocks as well as local at the incision sites.  An incision was made in the eighth interspace in approximately the midaxillary line, and an 8 mm robotic port was inserted.  The thoracoscope was advanced into the chest. After confirming intrapleural placement, carbon dioxide was insufflated per protocol.  A 12 mm robotic port was placed in the eighth interspace anterior to the camera port.  Intercostal nerve blocks then were performed from the third to the tenth interspace by injecting 10 mL of the bupivacaine solution into a subpleural plane at each level.  A 12 mm AirSeal  port was placed in the tenth interspace centered between the two anterior robotic ports and then an 8 mm robotic port was placed in line with the tip of the scapula.  The robot was deployed.  The camera arm was docked, targeting was performed.  The remaining arms were docked.  Robotic instruments were inserted with thoracoscopic visualization.  Inspection of the lung revealed no dominant blebs. Along the inferior aspect of the upper lobe and part of the middle lobe there was a chain of small blebs and then there were additional small blebs noted at the apex and on the lateral aspect of the right upper lobe and along the inferior aspect of the right lower lobe.  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  The robotic stapler was used to remove the visible blebs.  Blue staple cartridges were used.  The lower lobe blebs were  resected first followed by the middle and upper lobe blebs along the inferior edge, the apical blebs and then finally the lateral right  upper lobe blebs.  There was good hemostasis at all staple lines.  The pleura then was stripped from the apex from the 3rd interspace upward using bipolar  cautery.  The remainder of the pleura was lightly rubbed using a sponge to promote adhesion formation.  The sponges were removed.  The chest was copiously irrigated with saline.  A test inflation at 30 cm water revealed no air leakage and good reexpansion of the lung.  The robotic instruments were removed and the robot was undocked.  A 28-French Blake drain was placed through the anterior port incision and secured with #1 Silk suture.  The remaining incisions were closed in standard fashion.  The chest tube was placed to a Pleur-Evac on suction.  The patient then was extubated in the operating room and taken to the postanesthetic care unit in good condition.  All sponge, needle and instrument counts were correct at the end of the procedure.  Experienced assistance was necessary for this case due to surgical complexity.  Jillyn Hidden assisted with port placement, camera management, robot docking and undocking, instrument exchange, specimen retrieval, suctioning, and wound closure.   PUS D: 05/14/2023 3:36:30 pm T: 05/14/2023 4:35:00 pm  JOB: [id number]/ [id number]

## 2023-05-15 ENCOUNTER — Encounter (HOSPITAL_COMMUNITY): Payer: Self-pay | Admitting: Thoracic Surgery (Cardiothoracic Vascular Surgery)

## 2023-05-15 ENCOUNTER — Inpatient Hospital Stay (HOSPITAL_COMMUNITY): Payer: Self-pay

## 2023-05-15 LAB — CBC
HCT: 36.5 % — ABNORMAL LOW (ref 39.0–52.0)
Hemoglobin: 13.2 g/dL (ref 13.0–17.0)
MCH: 29.1 pg (ref 26.0–34.0)
MCHC: 36.2 g/dL — ABNORMAL HIGH (ref 30.0–36.0)
MCV: 80.6 fL (ref 80.0–100.0)
Platelets: 182 10*3/uL (ref 150–400)
RBC: 4.53 MIL/uL (ref 4.22–5.81)
RDW: 12.7 % (ref 11.5–15.5)
WBC: 11.8 10*3/uL — ABNORMAL HIGH (ref 4.0–10.5)
nRBC: 0 % (ref 0.0–0.2)

## 2023-05-15 LAB — BASIC METABOLIC PANEL
Anion gap: 7 (ref 5–15)
BUN: 14 mg/dL (ref 6–20)
CO2: 25 mmol/L (ref 22–32)
Calcium: 8.4 mg/dL — ABNORMAL LOW (ref 8.9–10.3)
Chloride: 105 mmol/L (ref 98–111)
Creatinine, Ser: 1.01 mg/dL (ref 0.61–1.24)
GFR, Estimated: >60 mL/min (ref >60)
Glucose, Bld: 101 mg/dL — ABNORMAL HIGH (ref 70–99)
Potassium: 3.7 mmol/L (ref 3.5–5.1)
Sodium: 137 mmol/L (ref 135–145)

## 2023-05-15 MED ORDER — LIDOCAINE 5 % EX PTCH
1.0000 | MEDICATED_PATCH | CUTANEOUS | Status: DC
  Administered 2023-05-15 – 2023-05-16 (×2): 1 via TRANSDERMAL
  Filled 2023-05-15 (×2): qty 1

## 2023-05-15 NOTE — Progress Notes (Addendum)
      [address]       [place] [address]             [phone]      1 Day Post-Op Procedure(s) (LRB): XI ROBOTIC ASSISTED THORACOSCOPY (Right) STAPLING OF BLEBS (Right) MECHANICAL PLEURADESIS (Right) INTERCOSTAL NERVE BLOCK (Right) Subjective: Sitting up, says he had a lot of pain early post-op but it's better now. No  shortness of breath. No nausea.   Objective: Vital signs in last 24 hours: Temp:  [97.8 F (36.6 C)-98.5 F (36.9 C)] 98 F (36.7 C) (08/30 0345) Pulse Rate:  [61-82] 66 (08/30 0415) Cardiac Rhythm: Normal sinus rhythm (08/29 1900) Resp:  [17-31] 24 (08/30 0415) BP: (113-149)/(71-93) 124/91 (08/30 0345) SpO2:  [90 %-100 %] 95 % (08/30 0415)     Intake/Output from previous day: 08/29 0701 - 08/30 0700 In: 2834.5 [I.V.:2434.5; IV Piggyback:400] Out: 570 [Urine:400; Chest Tube:170] Intake/Output this shift: No intake/output data recorded.  General appearance: alert, cooperative, and mild distress Neurologic: intact Heart: RRR, no arrhythmias Lungs: breath sounds clear, shallow. No air leak. Minimal drainage. CXR showing stable lateral space on the right.  Wound: port sites are intact and dry.  Lab Results: Recent Labs    05/13/23 2036 05/14/23 0535  WBC 5.8 7.0  HGB 15.2 15.4  HCT 42.4 42.7  PLT 217 213   BMET:  Recent Labs    05/13/23 0006 05/14/23 0535  NA 133* 136  K 3.4* 4.0  CL 99 103  CO2 23 24  GLUCOSE 101* 101*  BUN 13 14  CREATININE 0.98 1.09  CALCIUM 9.2 9.1    PT/INR: No results for input(s): "LABPROT", "INR" in the last 72 hours. ABG No results found for: "PHART", "HCO3", "TCO2", "ACIDBASEDEF", "O2SAT" CBG (last 3)  No results for input(s): "GLUCAP" in the last 72 hours.  Assessment/Plan: S/P Procedure(s) (LRB): XI ROBOTIC ASSISTED THORACOSCOPY (Right) STAPLING OF BLEBS (Right) MECHANICAL PLEURADESIS (Right) INTERCOSTAL NERVE BLOCK (Right)  -POD1 right pleural bleb wedge resection, apical  pleurectomy and pleural abrasion for recurrent spontaneous PTX. No air leak. CXR with stable lateral space. CT to water seal today.  Mobilize.     LOS: 2 days    Leary Roca, PA-C 05/15/2023 Patient seen and examined, agree with above No air leak- Ct to water seal If no leak in AM- dc chest tube, if post removal CXR ok can go home tomorrow  Viviann Spare C. Dorris Fetch, MD [facility] [phone]

## 2023-05-15 NOTE — Progress Notes (Signed)
PROGRESS NOTE    Alex Scott  NWG:[id number] DOB: 1996/01/15 DOA: 05/12/2023 PCP: Pcp, No    Brief Narrative:   Alex Scott is a 27 y.o. male with past medical history significant for THC use, vaping who presented to med [facility] ED on 8/28 for sudden onset sharp right-sided chest pain and shortness of breath.  Patient reports onset hour prior to ED arrival with associated increased pain on deep inspiration.     Recently admitted June 2024 for spontaneous pneumothorax, did not require chest tube or surgical procedure and following discharge follow-up x-ray at urgent care showed resolution.   In the ED, temperature 98.6 F, HR 80, RR 20, BP 155/87, SpO2 98% on room air.  WBC 9.8, hemoglobin 14.8, platelet count 209.  Sodium 133, potassium 3.4, chloride 99, CO2 23, glucose 101, BUN 13, creatinine 0.98.  Chest x-ray with increase in right-sided pneumothorax when compared with the prior exam measuring approximately 2.4 cm.  EDP discussed with PCCM who recommended admission to the hospitalist service and no indication for chest tube at this time.  Patient was transferred to [facility] under the hospitalist service for further evaluation and management of recurrent spontaneous pneumothorax.  Assessment & Plan:   Right pneumothorax, recurrent Patient presenting to the ED with acute onset right sided chest pain associated with shortness of breath, increased pain on deep inspiration.  Complicated by his history of vaping, THC use and heavy lifting at work.  Patient with previous pneumothorax June 2024, treated conservatively with oxygen with repeat chest x-ray outpatient at urgent care showing resolution.  Pulmonary critical care and CTS were consulted.  Patient underwent robotic assisted right thoracoscopy, stapling of blebs, mechanical pleurodesis with intercostal nerve blocks and chest tube placement by Dr. Dorris Fetch on 05/14/2023. -- CTS following, appreciate assistance -- Chest tube placed  to waterseal by CTS today --Toradol 15 mg IV every 6 hours x 48 hours -- Lidocaine patch -- Tramadol 50-100 mg p.o. every 6 hours as needed mild pain -- Oxycodone 5-10 mg every 4 hours as needed moderate pain -- Fentanyl 25-50 mcg IV every 2 hours as needed severe pain -- Further per CTS   THC use disorder Hx vaping Discussed need for complete cessation/abstinence given his recurrent episodes of pneumothorax.   DVT prophylaxis: enoxaparin (LOVENOX) injection 40 mg Start: 05/14/23 2215 SCD's Start: 05/14/23 1134    Code Status: Full Code Family Communication:   Disposition Plan:  Level of care: Progressive Status is: Inpatient Remains inpatient appropriate because: Per primary, CTS    Consultants:  PCCM Cardiothoracic surgery  Procedures:  robotic assisted right thoracoscopy, stapling of blebs, mechanical pleurodesis with intercostal nerve blocks and chest tube placement by Dr. Dorris Fetch on 05/14/2023.  Antimicrobials:  Perioperative cefazolin   Subjective: Patient seen examined at bedside, resting calmly.  Standing at edge of bed.  Family present.  Reports significant pain postoperatively overnight, improved with IV fentanyl.  Seen by CTS this morning, plan to place chest tube to waterseal.   No specific complaints, questions, concerns.  Denies headache, no visual changes, no chest pain, no palpitations, no shortness of breath, no abdominal pain, no fever/chills/night sweats, no nausea/vomiting/diarrhea, no focal weakness, no fatigue, no cough/congestion, no paresthesias.  No other acute events overnight per nursing staff.  Objective: Vitals:   05/14/23 2303 05/15/23 0345 05/15/23 0415 05/15/23 0831  BP: (!) 143/79 (!) 124/91  (!) 146/94  Pulse: 68 61 66 (!) 56  Resp: 20 (!) 21 (!) 24 19  Temp: 98.1 F (36.7 C) 98 F (36.7 C)  98.1 F (36.7 C)  TempSrc: Oral Oral  Oral  SpO2: 96% 90% 95% 96%  Weight:      Height:        Intake/Output Summary (Last 24 hours) at  05/15/2023 1037 Last data filed at 05/15/2023 0808 Gross per 24 hour  Intake 1134.45 ml  Output 1020 ml  Net 114.45 ml   Filed Weights   05/12/23 2257 05/13/23 0500 05/14/23 0500  Weight: 88.5 kg 91 kg 91 kg    Examination:  Physical Exam: GEN: NAD, alert and oriented x 3, wd/wn HEENT: NCAT, PERRL, EOMI, sclera clear, MMM PULM: CTAB w/o wheezes/crackles, normal respiratory effort, on room air, chest tube noted CV: RRR w/o M/G/R GI: abd soft, NTND, NABS, no R/G/M MSK: no peripheral edema, muscle strength globally intact 5/5 bilateral upper/lower extremities NEURO: CN II-XII intact, no focal deficits, sensation to light touch intact PSYCH: normal mood/affect Integumentary: dry/intact, no rashes or wounds    Data Reviewed: I have personally reviewed following labs and imaging studies  CBC: Recent Labs  Lab 05/13/23 0006 05/13/23 2036 05/14/23 0535 05/15/23 0725  WBC 9.8 5.8 7.0 11.8*  NEUTROABS 7.2  --  2.6  --   HGB 14.8 15.2 15.4 13.2  HCT 40.2 42.4 42.7 36.5*  MCV 80.2 83.0 80.6 80.6  PLT 209 217 213 182   Basic Metabolic Panel: Recent Labs  Lab 05/13/23 0006 05/14/23 0535 05/15/23 0725  NA 133* 136 137  K 3.4* 4.0 3.7  CL 99 103 105  CO2 23 24 25   GLUCOSE 101* 101* 101*  BUN 13 14 14   CREATININE 0.98 1.09 1.01  CALCIUM 9.2 9.1 8.4*  MG  --  2.0  --    GFR: Estimated Creatinine Clearance: 125.7 mL/min (by C-G formula based on SCr of 1.01 mg/dL). Liver Function Tests: Recent Labs  Lab 05/14/23 0535  AST 26  ALT 34  ALKPHOS 44  BILITOT 1.2  PROT 7.3  ALBUMIN 4.0   No results for input(s): "LIPASE", "AMYLASE" in the last 168 hours. No results for input(s): "AMMONIA" in the last 168 hours. Coagulation Profile: No results for input(s): "INR", "PROTIME" in the last 168 hours. Cardiac Enzymes: No results for input(s): "CKTOTAL", "CKMB", "CKMBINDEX", "TROPONINI" in the last 168 hours. BNP (last 3 results) No results for input(s): "PROBNP" in the  last 8760 hours. HbA1C: No results for input(s): "HGBA1C" in the last 72 hours. CBG: No results for input(s): "GLUCAP" in the last 168 hours. Lipid Profile: No results for input(s): "CHOL", "HDL", "LDLCALC", "TRIG", "CHOLHDL", "LDLDIRECT" in the last 72 hours. Thyroid Function Tests: No results for input(s): "TSH", "T4TOTAL", "FREET4", "T3FREE", "THYROIDAB" in the last 72 hours. Anemia Panel: No results for input(s): "VITAMINB12", "FOLATE", "FERRITIN", "TIBC", "IRON", "RETICCTPCT" in the last 72 hours. Sepsis Labs: No results for input(s): "PROCALCITON", "LATICACIDVEN" in the last 168 hours.  Recent Results (from the past 240 hour(s))  Surgical pcr screen     Status: None   Collection Time: 05/13/23 11:54 PM   Specimen: Nasal Mucosa; Nasal Swab  Result Value Ref Range Status   MRSA, PCR NEGATIVE NEGATIVE Final   Staphylococcus aureus NEGATIVE NEGATIVE Final    Comment: (NOTE) The Xpert SA Assay (FDA approved for NASAL specimens in patients 50 years of age and older), is one component of a comprehensive surveillance program. It is not intended to diagnose infection nor to guide or monitor treatment. Performed at [facility],  [address], [place], [place] [address]          Radiology Studies: DG Chest Port 1 View  Result Date: 05/15/2023 CLINICAL DATA:  Status post robotic assisted surgical procedure. EXAM: PORTABLE CHEST 1 VIEW COMPARISON:  May 14, 2023. FINDINGS: The heart size and mediastinal contours are within normal limits. Right-sided chest tube is unchanged in position. No definite pneumothorax is noted. Minimal right basilar subsegmental atelectasis is noted. The visualized skeletal structures are unremarkable. IMPRESSION: Stable right-sided chest tube. No pneumothorax. Minimal right basilar subsegmental atelectasis. Electronically Signed   By: Lupita Raider M.D.   On: 05/15/2023 08:13   DG CHEST PORT 1 VIEW  Result Date: 05/14/2023 CLINICAL DATA:   Right chest tube in place, history of pneumothorax EXAM: PORTABLE CHEST 1 VIEW COMPARISON:  05/14/2023 FINDINGS: Single frontal view of the chest demonstrates stable position of the right chest tube. There is no evidence of recurrent or residual pneumothorax. Cardiac silhouette is unremarkable. No airspace disease or effusion. No acute bony abnormalities. IMPRESSION: 1. Resolution of right apical pneumothorax, with stable position of the indwelling right chest tube. Electronically Signed   By: Sharlet Salina M.D.   On: 05/14/2023 22:56   DG Chest Port 1 View  Result Date: 05/14/2023 CLINICAL DATA:  Status post thoracotomy. EXAM: PORTABLE CHEST 1 VIEW COMPARISON:  05/14/2023 FINDINGS: Interval placement of right-sided chest tube, tip about the medial right apex. Diminished, less than 10% right lateral and right apical pneumothorax. Surgical suture line about the peripheral right upper lobe. Left lung is normally aerated. Heart and mediastinum normal. Osseous structures unremarkable. IMPRESSION: Interval placement of right-sided chest tube, tip about the medial right apex. Diminished, less than 10% right lateral and right apical pneumothorax. Surgical suture line about the peripheral right upper lobe. Electronically Signed   By: Jearld Lesch M.D.   On: 05/14/2023 14:33   DG Chest Port 1 View  Result Date: 05/14/2023 CLINICAL DATA:  Right side pneumothorax EXAM: PORTABLE CHEST 1 VIEW COMPARISON:  05/13/2023 FINDINGS: Right pneumothorax again noted, slightly decreased in size since prior study. Lungs are clear. No effusions. Heart and mediastinal contours within normal limits. IMPRESSION: Small to moderate right pneumothorax, slightly decreased in size since prior study. Electronically Signed   By: Charlett Nose M.D.   On: 05/14/2023 10:20        Scheduled Meds:  acetaminophen  1,000 mg Oral Q6H   Or   acetaminophen (TYLENOL) oral liquid 160 mg/5 mL  1,000 mg Oral Q6H   bisacodyl  10 mg Oral Daily    enoxaparin (LOVENOX) injection  40 mg Subcutaneous Daily   gabapentin  300 mg Oral QHS   Followed by   Melene [facility]er ON 05/17/2023] gabapentin  300 mg Oral BID   ketorolac  15 mg Intravenous Q6H   lidocaine  1 patch Transdermal Q24H   pantoprazole  40 mg Oral Daily   senna-docusate  1 tablet Oral QHS   Continuous Infusions:     LOS: 2 days    Time spent: 48 minutes spent on chart review, discussion with nursing staff, consultants, updating family and interview/physical exam; more than 50% of that time was spent in counseling and/or coordination of care.    Alvira Philips [place], DO [facility] Available via Epic secure chat 7am-7pm After these hours, please refer to coverage provider listed on [email or web address] 05/15/2023, 10:37 AM

## 2023-05-16 ENCOUNTER — Inpatient Hospital Stay (HOSPITAL_COMMUNITY): Payer: Self-pay

## 2023-05-16 LAB — COMPREHENSIVE METABOLIC PANEL
ALT: 26 U/L (ref 0–44)
AST: 23 U/L (ref 15–41)
Albumin: 3.2 g/dL — ABNORMAL LOW (ref 3.5–5.0)
Alkaline Phosphatase: 36 U/L — ABNORMAL LOW (ref 38–126)
Anion gap: 9 (ref 5–15)
BUN: 14 mg/dL (ref 6–20)
CO2: 25 mmol/L (ref 22–32)
Calcium: 8.7 mg/dL — ABNORMAL LOW (ref 8.9–10.3)
Chloride: 103 mmol/L (ref 98–111)
Creatinine, Ser: 1.03 mg/dL (ref 0.61–1.24)
GFR, Estimated: >60 mL/min (ref >60)
Glucose, Bld: 109 mg/dL — ABNORMAL HIGH (ref 70–99)
Potassium: 3.6 mmol/L (ref 3.5–5.1)
Sodium: 137 mmol/L (ref 135–145)
Total Bilirubin: 0.8 mg/dL (ref 0.3–1.2)
Total Protein: 6 g/dL — ABNORMAL LOW (ref 6.5–8.1)

## 2023-05-16 LAB — CBC
HCT: 35.5 % — ABNORMAL LOW (ref 39.0–52.0)
Hemoglobin: 12.6 g/dL — ABNORMAL LOW (ref 13.0–17.0)
MCH: 29.2 pg (ref 26.0–34.0)
MCHC: 35.5 g/dL (ref 30.0–36.0)
MCV: 82.4 fL (ref 80.0–100.0)
Platelets: 179 10*3/uL (ref 150–400)
RBC: 4.31 MIL/uL (ref 4.22–5.81)
RDW: 12.8 % (ref 11.5–15.5)
WBC: 7.3 10*3/uL (ref 4.0–10.5)
nRBC: 0 % (ref 0.0–0.2)

## 2023-05-16 MED ORDER — OXYCODONE HCL 5 MG PO TABS: 5.0000 mg | ORAL_TABLET | ORAL | 0 refills | Status: DC | PRN

## 2023-05-16 NOTE — Progress Notes (Signed)
Chest tube removed per order,pt tolerated the procedure well, vitals taken

## 2023-05-16 NOTE — Progress Notes (Signed)
PROGRESS NOTE    Alex Scott  UJW:[id number] DOB: 03/17/1996 DOA: 05/12/2023 PCP: Pcp, No    Brief Narrative:   Alex Scott is a 27 y.o. male with past medical history significant for THC use, vaping who presented to med [facility] ED on 8/28 for sudden onset sharp right-sided chest pain and shortness of breath.  Patient reports onset hour prior to ED arrival with associated increased pain on deep inspiration.     Recently admitted June 2024 for spontaneous pneumothorax, did not require chest tube or surgical procedure and following discharge follow-up x-ray at urgent care showed resolution.   In the ED, temperature 98.6 F, HR 80, RR 20, BP 155/87, SpO2 98% on room air.  WBC 9.8, hemoglobin 14.8, platelet count 209.  Sodium 133, potassium 3.4, chloride 99, CO2 23, glucose 101, BUN 13, creatinine 0.98.  Chest x-ray with increase in right-sided pneumothorax when compared with the prior exam measuring approximately 2.4 cm.  EDP discussed with PCCM who recommended admission to the hospitalist service and no indication for chest tube at this time.  Patient was transferred to [facility] under the hospitalist service for further evaluation and management of recurrent spontaneous pneumothorax.  Assessment & Plan:   Right pneumothorax, recurrent Patient presenting to the ED with acute onset right sided chest pain associated with shortness of breath, increased pain on deep inspiration.  Complicated by his history of vaping, THC use and heavy lifting at work.  Patient with previous pneumothorax June 2024, treated conservatively with oxygen with repeat chest x-ray outpatient at urgent care showing resolution.  Pulmonary critical care and CTS were consulted.  Patient underwent robotic assisted right thoracoscopy, stapling of blebs, mechanical pleurodesis with intercostal nerve blocks and chest tube placement by Dr. Dorris Fetch on 05/14/2023. -- CTS following, appreciate assistance --Toradol 15 mg IV  every 6 hours x 48 hours -- Lidocaine patch -- Tramadol 50-100 mg p.o. every 6 hours as needed mild pain -- Oxycodone 5-10 mg every 4 hours as needed moderate pain -- Fentanyl 25-50 mcg IV every 2 hours as needed severe pain -- Chest tube discontinued this morning by CTS, plan repeat chest x-ray at 12 PM and likely discharge home   THC use disorder Hx vaping Discussed need for complete cessation/abstinence given his recurrent episodes of pneumothorax.   DVT prophylaxis: enoxaparin (LOVENOX) injection 40 mg Start: 05/14/23 2215 SCD's Start: 05/14/23 1134    Code Status: Full Code Family Communication: No family present at bedside this morning  Disposition Plan:  Level of care: Progressive Status is: Inpatient Remains inpatient appropriate because: Per primary, CTS    Consultants:  PCCM Cardiothoracic surgery  Procedures:  robotic assisted right thoracoscopy, stapling of blebs, mechanical pleurodesis with intercostal nerve blocks and chest tube placement by Dr. Dorris Fetch on 05/14/2023.  Antimicrobials:  Perioperative cefazolin   Subjective: Patient seen examined at bedside, resting calmly.  Standing at edge of bed.  RN present.  Asking about whether he can return to work and what kind of activities.  Discussed with him cannot perform any heavy lifting, bending, stooping over the next few weeks.  Pain controlled now that chest tube was removed this morning.  Plan for repeat chest x-ray this afternoon with possible discharge home.  No other specific complaints, questions, concerns.  Denies headache, no visual changes, no chest pain, no palpitations, no shortness of breath, no abdominal pain, no fever/chills/night sweats, no nausea/vomiting/diarrhea, no focal weakness, no fatigue, no cough/congestion, no paresthesias.  No other acute events  overnight per nursing staff.  Objective: Vitals:   05/15/23 1615 05/15/23 2323 05/16/23 0808 05/16/23 0818  BP: 138/65 138/81 129/69 128/78   Pulse: 86 62 70 68  Resp: (!) 23 20 17 20   Temp: 98.2 F (36.8 C) 98.4 F (36.9 C) 98.4 F (36.9 C) 97.6 F (36.4 C)  TempSrc: Oral Oral Oral Oral  SpO2: 96% 92% 96% 100%  Weight:      Height:        Intake/Output Summary (Last 24 hours) at 05/16/2023 1033 Last data filed at 05/15/2023 1825 Gross per 24 hour  Intake --  Output 42 ml  Net -42 ml   Filed Weights   05/12/23 2257 05/13/23 0500 05/14/23 0500  Weight: 88.5 kg 91 kg 91 kg    Examination:  Physical Exam: GEN: NAD, alert and oriented x 3, wd/wn HEENT: NCAT, PERRL, EOMI, sclera clear, MMM PULM: CTAB w/o wheezes/crackles, normal respiratory effort, on room air CV: RRR w/o M/G/R GI: abd soft, NTND, NABS, no R/G/M MSK: no peripheral edema, muscle strength globally intact 5/5 bilateral upper/lower extremities NEURO: CN II-XII intact, no focal deficits, sensation to light touch intact PSYCH: normal mood/affect Integumentary: dry/intact, no rashes or wounds    Data Reviewed: I have personally reviewed following labs and imaging studies  CBC: Recent Labs  Lab 05/13/23 0006 05/13/23 2036 05/14/23 0535 05/15/23 0725 05/16/23 0354  WBC 9.8 5.8 7.0 11.8* 7.3  NEUTROABS 7.2  --  2.6  --   --   HGB 14.8 15.2 15.4 13.2 12.6*  HCT 40.2 42.4 42.7 36.5* 35.5*  MCV 80.2 83.0 80.6 80.6 82.4  PLT 209 217 213 182 179   Basic Metabolic Panel: Recent Labs  Lab 05/13/23 0006 05/14/23 0535 05/15/23 0725 05/16/23 0354  NA 133* 136 137 137  K 3.4* 4.0 3.7 3.6  CL 99 103 105 103  CO2 23 24 25 25   GLUCOSE 101* 101* 101* 109*  BUN 13 14 14 14   CREATININE 0.98 1.09 1.01 1.03  CALCIUM 9.2 9.1 8.4* 8.7*  MG  --  2.0  --   --    GFR: Estimated Creatinine Clearance: 123.3 mL/min (by C-G formula based on SCr of 1.03 mg/dL). Liver Function Tests: Recent Labs  Lab 05/14/23 0535 05/16/23 0354  AST 26 23  ALT 34 26  ALKPHOS 44 36*  BILITOT 1.2 0.8  PROT 7.3 6.0*  ALBUMIN 4.0 3.2*   No results for input(s):  "LIPASE", "AMYLASE" in the last 168 hours. No results for input(s): "AMMONIA" in the last 168 hours. Coagulation Profile: No results for input(s): "INR", "PROTIME" in the last 168 hours. Cardiac Enzymes: No results for input(s): "CKTOTAL", "CKMB", "CKMBINDEX", "TROPONINI" in the last 168 hours. BNP (last 3 results) No results for input(s): "PROBNP" in the last 8760 hours. HbA1C: No results for input(s): "HGBA1C" in the last 72 hours. CBG: No results for input(s): "GLUCAP" in the last 168 hours. Lipid Profile: No results for input(s): "CHOL", "HDL", "LDLCALC", "TRIG", "CHOLHDL", "LDLDIRECT" in the last 72 hours. Thyroid Function Tests: No results for input(s): "TSH", "T4TOTAL", "FREET4", "T3FREE", "THYROIDAB" in the last 72 hours. Anemia Panel: No results for input(s): "VITAMINB12", "FOLATE", "FERRITIN", "TIBC", "IRON", "RETICCTPCT" in the last 72 hours. Sepsis Labs: No results for input(s): "PROCALCITON", "LATICACIDVEN" in the last 168 hours.  Recent Results (from the past 240 hour(s))  Surgical pcr screen     Status: None   Collection Time: 05/13/23 11:54 PM   Specimen: Nasal Mucosa; Nasal Swab  Result  Value Ref Range Status   MRSA, PCR NEGATIVE NEGATIVE Final   Staphylococcus aureus NEGATIVE NEGATIVE Final    Comment: (NOTE) The Xpert SA Assay (FDA approved for NASAL specimens in patients 58 years of age and older), is one component of a comprehensive surveillance program. It is not intended to diagnose infection nor to guide or monitor treatment. Performed at [facility], [address], [place], [place] [address]          Radiology Studies: DG CHEST PORT 1 VIEW  Result Date: 05/16/2023 CLINICAL DATA:  Follow-up chest tube placement for pneumothorax. EXAM: PORTABLE CHEST 1 VIEW COMPARISON:  05/15/2023 FINDINGS: Heart size and mediastinal contours are unremarkable. There is a right-sided chest tube with tip terminating over the medial right apex. No significant  pneumothorax identified at this time. Postoperative changes within the periphery of the right upper lobe are again noted with 2 adjacent suture chains. No signs of pleural effusion, interstitial edema or consolidative change. IMPRESSION: Right-sided chest tube with tip terminating over the medial right apex. No significant pneumothorax identified at this time. Electronically Signed   By: Signa Kell M.D.   On: 05/16/2023 09:18   DG Chest Port 1 View  Result Date: 05/15/2023 CLINICAL DATA:  Status post robotic assisted surgical procedure. EXAM: PORTABLE CHEST 1 VIEW COMPARISON:  May 14, 2023. FINDINGS: The heart size and mediastinal contours are within normal limits. Right-sided chest tube is unchanged in position. No definite pneumothorax is noted. Minimal right basilar subsegmental atelectasis is noted. The visualized skeletal structures are unremarkable. IMPRESSION: Stable right-sided chest tube. No pneumothorax. Minimal right basilar subsegmental atelectasis. Electronically Signed   By: Lupita Raider M.D.   On: 05/15/2023 08:13   DG CHEST PORT 1 VIEW  Result Date: 05/14/2023 CLINICAL DATA:  Right chest tube in place, history of pneumothorax EXAM: PORTABLE CHEST 1 VIEW COMPARISON:  05/14/2023 FINDINGS: Single frontal view of the chest demonstrates stable position of the right chest tube. There is no evidence of recurrent or residual pneumothorax. Cardiac silhouette is unremarkable. No airspace disease or effusion. No acute bony abnormalities. IMPRESSION: 1. Resolution of right apical pneumothorax, with stable position of the indwelling right chest tube. Electronically Signed   By: Sharlet Salina M.D.   On: 05/14/2023 22:56   DG Chest Port 1 View  Result Date: 05/14/2023 CLINICAL DATA:  Status post thoracotomy. EXAM: PORTABLE CHEST 1 VIEW COMPARISON:  05/14/2023 FINDINGS: Interval placement of right-sided chest tube, tip about the medial right apex. Diminished, less than 10% right lateral and  right apical pneumothorax. Surgical suture line about the peripheral right upper lobe. Left lung is normally aerated. Heart and mediastinum normal. Osseous structures unremarkable. IMPRESSION: Interval placement of right-sided chest tube, tip about the medial right apex. Diminished, less than 10% right lateral and right apical pneumothorax. Surgical suture line about the peripheral right upper lobe. Electronically Signed   By: Jearld Lesch M.D.   On: 05/14/2023 14:33        Scheduled Meds:  acetaminophen  1,000 mg Oral Q6H   Or   acetaminophen (TYLENOL) oral liquid 160 mg/5 mL  1,000 mg Oral Q6H   bisacodyl  10 mg Oral Daily   enoxaparin (LOVENOX) injection  40 mg Subcutaneous Daily   gabapentin  300 mg Oral QHS   Followed by   Melene Muller ON 05/17/2023] gabapentin  300 mg Oral BID   lidocaine  1 patch Transdermal Q24H   pantoprazole  40 mg Oral Daily  senna-docusate  1 tablet Oral QHS   Continuous Infusions:     LOS: 3 days    Time spent: 48 minutes spent on chart review, discussion with nursing staff, consultants, updating family and interview/physical exam; more than 50% of that time was spent in counseling and/or coordination of care.    Alvira Philips [place], DO [facility] Available via Epic secure chat 7am-7pm After these hours, please refer to coverage provider listed on [email or web address] 05/16/2023, 10:33 AM

## 2023-05-16 NOTE — Progress Notes (Signed)
      [address]       [place] [address]             [phone]      2 Days Post-Op Procedure(s) (LRB): XI ROBOTIC ASSISTED THORACOSCOPY (Right) STAPLING OF BLEBS (Right) MECHANICAL PLEURADESIS (Right) INTERCOSTAL NERVE BLOCK (Right)  Subjective:  Patient doing alright.  A little more sore this morning.  Objective: Vital signs in last 24 hours: Temp:  [98.1 F (36.7 C)-98.4 F (36.9 C)] 98.4 F (36.9 C) (08/30 2323) Pulse Rate:  [56-86] 62 (08/30 2323) Cardiac Rhythm: Normal sinus rhythm (08/30 1950) Resp:  [19-23] 20 (08/30 2323) BP: (138-146)/(65-94) 138/81 (08/30 2323) SpO2:  [92 %-96 %] 92 % (08/30 2323)  Intake/Output from previous day: 08/30 0701 - 08/31 0700 In: -  Out: 492 [Urine:450; Chest Tube:42]  General appearance: alert, cooperative, and no distress Heart: regular rate and rhythm Lungs: clear to auscultation bilaterally Wound: clean and dry  Lab Results: Recent Labs    05/15/23 0725 05/16/23 0354  WBC 11.8* 7.3  HGB 13.2 12.6*  HCT 36.5* 35.5*  PLT 182 179   BMET:  Recent Labs    05/15/23 0725 05/16/23 0354  NA 137 137  K 3.7 3.6  CL 105 103  CO2 25 25  GLUCOSE 101* 109*  BUN 14 14  CREATININE 1.01 1.03  CALCIUM 8.4* 8.7*    PT/INR: No results for input(s): "LABPROT", "INR" in the last 72 hours. ABG No results found for: "PHART", "HCO3", "TCO2", "ACIDBASEDEF", "O2SAT" CBG (last 3)  No results for input(s): "GLUCAP" in the last 72 hours.  Assessment/Plan: S/P Procedure(s) (LRB): XI ROBOTIC ASSISTED THORACOSCOPY (Right) STAPLING OF BLEBS (Right) MECHANICAL PLEURADESIS (Right) INTERCOSTAL NERVE BLOCK (Right)  Recurrent Spontaneous Pneumothorax- S/P Robotic VATS with resection, mechanical pleurodesis, pleurectomy.. CT on water seal w/o air leak.. CXR is stable  Plan:   D/C chest tube this morning.. will get CXR @ 12 if stable will d/c home today as discussed with Dr. Dorris Fetch    LOS: 3 days    Lowella Dandy, PA-C 05/16/2023

## 2023-05-17 ENCOUNTER — Other Ambulatory Visit: Payer: Self-pay | Admitting: Physician Assistant

## 2023-05-17 NOTE — Progress Notes (Signed)
Patient contacted hospital that [facility] in [place] did not receive the prescription.  However, I contacted the pharmacy and they did receive the prescription, however they did not fill this waiting to have insurance to file.   I explained that the patient is un-insured.  They will fill the prescription, patient contacted to instruct he should be able to pick up prescription.  Lowella Dandy, PA-C 12:17 PM

## 2023-05-20 ENCOUNTER — Encounter: Payer: Self-pay | Admitting: *Deleted

## 2023-05-20 NOTE — Progress Notes (Unsigned)
      [address]       [place] [address]             [phone]       HPI:  Patient returns for routine postoperative follow-up having undergone Robotic Assisted Right Video Assisted Thorascopic blebectomy, mechanical pleurodesis, and pleurectomy on 8/29 The patient's early postoperative recovery while in the hospital was without difficulty.  He was discharged home on 8/31. Since hospital discharge the patient reports he is doing very well.  He states he has quit smoking.  He has no further chest pain or shortness of breath.   Current Outpatient Medications  Medication Sig Dispense Refill   acetaminophen (TYLENOL) 500 MG tablet Take 500 mg by mouth 2 (two) times daily as needed for moderate pain.     oxyCODONE (OXY IR/ROXICODONE) 5 MG immediate release tablet Take 1 tablet (5 mg total) by mouth every 4 (four) hours as needed for moderate pain. 30 tablet 0   No current facility-administered medications for this visit.    Physical Exam:  BP 138/81 (BP Location: Left Arm, Patient Position: Sitting, Cuff Size: Normal)   Pulse 78   Resp 20   Ht 5\' 10"  (1.778 m)   Wt 198 lb (89.8 kg)   SpO2 97% Comment: RA  BMI 28.41 kg/m   Gen: NAD Heart RRR Lungs: CTA bilaterally Incision: C/D/I  Diagnostic Tests:  CXR; no pneumothorax present  A/P  S/P Robotic Assisted Right VATS with blebectomy, mechanical pleurodesis, and pleurectomy.. CXR w/o pneumothorax Patient has quit smoking RTW slip provided RTC prn   Lowella Dandy, PA-C [facility] [phone]

## 2023-05-21 LAB — SURGICAL PATHOLOGY

## 2023-05-25 ENCOUNTER — Other Ambulatory Visit: Payer: Self-pay | Admitting: Thoracic Surgery (Cardiothoracic Vascular Surgery)

## 2023-05-25 DIAGNOSIS — Z9889 Other specified postprocedural states: Secondary | ICD-10-CM

## 2023-05-26 ENCOUNTER — Ambulatory Visit
Admission: RE | Admit: 2023-05-26 | Discharge: 2023-05-26 | Disposition: A | Discharge status: Home / Self Care | Payer: Self-pay | Source: Ambulatory Visit | Attending: Thoracic Surgery (Cardiothoracic Vascular Surgery) | Admitting: Thoracic Surgery (Cardiothoracic Vascular Surgery)

## 2023-05-26 ENCOUNTER — Ambulatory Visit (INDEPENDENT_AMBULATORY_CARE_PROVIDER_SITE_OTHER): Payer: Self-pay | Admitting: Physician Assistant

## 2023-05-26 VITALS — BP 138/81 | HR 78 | Resp 20 | Ht 70.0 in | Wt 198.0 lb

## 2023-05-26 DIAGNOSIS — Z9889 Other specified postprocedural states: Secondary | ICD-10-CM

## 2023-05-28 NOTE — Progress Notes (Signed)
Reviewed and agree with assessment/plan.   Coralyn Helling, MD [facility] Pulmonary/Critical Care 05/28/2023, 1:11 PM Pager:  [phone]

## 2024-05-08 ENCOUNTER — Emergency Department (HOSPITAL_COMMUNITY)

## 2024-05-08 ENCOUNTER — Emergency Department (HOSPITAL_COMMUNITY): Admission: EM | Admit: 2024-05-08 | Discharge: 2024-05-08 | Disposition: A | Discharge status: Home / Self Care

## 2024-05-08 ENCOUNTER — Other Ambulatory Visit: Payer: Self-pay

## 2024-05-08 DIAGNOSIS — R0602 Shortness of breath: Secondary | ICD-10-CM | POA: Insufficient documentation

## 2024-05-08 DIAGNOSIS — R0789 Other chest pain: Secondary | ICD-10-CM

## 2024-05-08 LAB — BASIC METABOLIC PANEL WITH GFR
Anion gap: 11 (ref 5–15)
BUN: 6 mg/dL (ref 6–20)
CO2: 21 mmol/L — ABNORMAL LOW (ref 22–32)
Calcium: 9.2 mg/dL (ref 8.9–10.3)
Chloride: 107 mmol/L (ref 98–111)
Creatinine, Ser: 0.97 mg/dL (ref 0.61–1.24)
GFR, Estimated: >60 mL/min (ref >60)
Glucose, Bld: 93 mg/dL (ref 70–99)
Potassium: 3.2 mmol/L — ABNORMAL LOW (ref 3.5–5.1)
Sodium: 139 mmol/L (ref 135–145)

## 2024-05-08 LAB — CBC
HCT: 39.3 % (ref 39.0–52.0)
Hemoglobin: 14.1 g/dL (ref 13.0–17.0)
MCH: 28.7 pg (ref 26.0–34.0)
MCHC: 35.9 g/dL (ref 30.0–36.0)
MCV: 80 fL (ref 80.0–100.0)
Platelets: 254 K/uL (ref 150–400)
RBC: 4.91 MIL/uL (ref 4.22–5.81)
RDW: 12.8 % (ref 11.5–15.5)
WBC: 5.9 K/uL (ref 4.0–10.5)
nRBC: 0 % (ref 0.0–0.2)

## 2024-05-08 LAB — D-DIMER, QUANTITATIVE: D-Dimer, Quant: 0.7 ug{FEU}/mL — ABNORMAL HIGH (ref 0.00–0.50)

## 2024-05-08 MED ORDER — LACTATED RINGERS IV BOLUS
1000.0000 mL | Freq: Once | INTRAVENOUS | Status: AC
  Administered 2024-05-08: 1000 mL via INTRAVENOUS

## 2024-05-08 MED ORDER — IOHEXOL 350 MG/ML SOLN
75.0000 mL | Freq: Once | INTRAVENOUS | Status: AC | PRN
  Administered 2024-05-08: 75 mL via INTRAVENOUS

## 2024-05-08 MED ORDER — POTASSIUM CHLORIDE CRYS ER 20 MEQ PO TBCR
40.0000 meq | EXTENDED_RELEASE_TABLET | Freq: Once | ORAL | Status: AC
  Administered 2024-05-08: 40 meq via ORAL
  Filled 2024-05-08: qty 2

## 2024-05-08 MED ORDER — ALPRAZOLAM 0.25 MG PO TABS
0.2500 mg | ORAL_TABLET | Freq: Once | ORAL | Status: AC
  Administered 2024-05-08: 0.25 mg via ORAL
  Filled 2024-05-08: qty 1

## 2024-05-08 NOTE — ED Provider Notes (Signed)
 [place] EMERGENCY DEPARTMENT AT [facility] Provider Note   CSN: [id number] Arrival date & time: 05/08/24  1321     Patient presents with: Shortness of Breath   Alex Scott is a 28 y.o. male.   This is a 28 year old male presenting emergency department with shortness of breath.  Reports he was in normal state of health yesterday, had some alcoholic drinks, went to work this morning and around 9:00 this morning started feeling lightheaded and short of breath.  Has a history of spontaneous pneumothorax on the same side.  No history of asthma.  Not a current smoker.  Low risk for PE based on Wells criteria   Shortness of Breath      Prior to Admission medications   Not on File    Allergies: Patient has no known allergies.    Review of Systems  Respiratory:  Positive for shortness of breath.     Updated Vital Signs BP (!) 149/103 (BP Location: Right Arm)   Pulse (!) 113   Temp 97.9 F (36.6 C) (Oral)   Resp (!) 30   SpO2 100%   Physical Exam Vitals and nursing note reviewed.  Constitutional:      General: He is not in acute distress.    Appearance: He is not toxic-appearing.  HENT:     Head: Normocephalic.  Cardiovascular:     Rate and Rhythm: Normal rate and regular rhythm.  Pulmonary:     Effort: Tachypnea present. No respiratory distress.     Breath sounds: No decreased breath sounds, wheezing, rhonchi or rales.  Musculoskeletal:     Cervical back: Normal range of motion.     Right lower leg: No edema.     Left lower leg: No edema.  Skin:    General: Skin is warm and dry.     Capillary Refill: Capillary refill takes less than 2 seconds.  Neurological:     Mental Status: He is alert and oriented to person, place, and time.  Psychiatric:        Mood and Affect: Mood is anxious.     (all labs ordered are listed, but only abnormal results are displayed) Labs Reviewed  BASIC METABOLIC PANEL WITH GFR - Abnormal; Notable for the following  components:      Result Value   Potassium 3.2 (*)    CO2 21 (*)    All other components within normal limits  D-DIMER, QUANTITATIVE - Abnormal; Notable for the following components:   D-Dimer, Quant 0.70 (*)    All other components within normal limits  CBC    EKG: EKG Interpretation Date/Time:  Sunday May 08 2024 13:31:06 EDT Ventricular Rate:  110 PR Interval:  152 QRS Duration:  82 QT Interval:  310 QTC Calculation: 419 R Axis:   59  Text Interpretation: Sinus tachycardia Nonspecific ST and T wave abnormality Abnormal ECG When compared with ECG of 13-May-2023 03:25, PREVIOUS ECG IS PRESENT Confirmed by Neysa Clap [id number] on 05/08/2024 2:15:02 PM  Radiology: DG Chest Portable 1 View Result Date: 05/08/2024 CLINICAL DATA:  Shortness of breath. EXAM: PORTABLE CHEST 1 VIEW COMPARISON:  Chest radiograph dated 05/26/2023. FINDINGS: The heart size and mediastinal contours are within normal limits. Both lungs are clear. The visualized skeletal structures are unremarkable. IMPRESSION: No active disease. Electronically Signed   By: Vanetta Chou M.D.   On: 05/08/2024 14:45     Procedures   Medications Ordered in the ED  potassium chloride  SA (KLOR-CON  M) CR  tablet 40 mEq (has no administration in time range)  lactated ringers  bolus 1,000 mL (1,000 mLs Intravenous New Bag/Given 05/08/24 1421)  ALPRAZolam  (XANAX ) tablet 0.25 mg (0.25 mg Oral Given 05/08/24 1423)                                    Medical Decision Making 28 year old male presented for shortness of breath.  He is afebrile, he is tachycardic and tachypneic, but does not appear to be in overt respiratory distress.  He appears quite anxious on exam.  Given Xanax .  Equal breath sounds and clear lungs.  Initial chest x-ray without pneumothorax my dependent review.  Radiology agrees.  He has no leukocytosis or fever to suggest infectious process.  Mildly low potassium, repleted.  He is low risk for PE based on Wells  criteria, however is tachycardic, therefore D-dimer obtained.  It is elevated.  Will get CT scan to rule out PE.  Care signed out to afternoon team.  Disposition pending scan.  Amount and/or Complexity of Data Reviewed External Data Reviewed:     Details: Does have prior spontaneous pneumothorax in 2024 and is status post stapling of blebs Labs: ordered.    Details: See above Radiology: ordered and independent interpretation performed.    Details: See above ECG/medicine tests: independent interpretation performed.    Details: Sinus rhythm.  No STEMI  Risk Prescription drug management. Diagnosis or treatment significantly limited by social determinants of health. Risk Details: Poor health literacy       Final diagnoses:  None    ED Discharge Orders     None          Neysa Caron PARAS, DO 05/08/24 1522

## 2024-05-08 NOTE — ED Provider Notes (Signed)
 Pt with chest pain seems to be a little more right sided started at work today.  Patient has had prior history of spontaneous pneumothorax that is required chest tube and surgical intervention.  Chest x-ray here without significant pneumothorax but patient is tachycardic and D-dimer is mildly elevated and waiting on a CTA. Patient CTA is negative for PE or other acute findings.  Patient's vital signs remained stable.  Sats are 100%.  At this time the findings were discussed with the patient and his family member.  They are comfortable with discharge at this time.   Doretha Folks, MD 05/08/24 [phone]

## 2024-05-08 NOTE — ED Notes (Signed)
 Patient transported to CT

## 2024-05-08 NOTE — ED Triage Notes (Signed)
 Pt c.o sudden onset of SOB while working. Hx of spontaneous pneumo. Pt very tachypneic in triage

## 2024-05-08 NOTE — Discharge Instructions (Addendum)
 Thankfully today the x-rays and blood work all looked normal.  There is no sign of blood clot or collapsed lung.  The pain could be related to overuse from your job.  You can take Tylenol  and ibuprofen  as needed for the pain.  Make sure you remain hydrated
